# Patient Record
Sex: Female | Born: 1955
Health system: Southern US, Community
[De-identification: ages and names within clinical notes are randomized; demographics above are authoritative.]

## PROBLEM LIST (undated history)

## (undated) DIAGNOSIS — I10 Essential (primary) hypertension: Secondary | ICD-10-CM

## (undated) DIAGNOSIS — J45909 Unspecified asthma, uncomplicated: Secondary | ICD-10-CM

## (undated) HISTORY — PX: NO PAST SURGERIES: SHX2092

## (undated) HISTORY — DX: Essential (primary) hypertension: I10

## (undated) HISTORY — DX: Unspecified asthma, uncomplicated: J45.909

---

## 2003-08-11 ENCOUNTER — Encounter: Payer: Self-pay | Admitting: Family Medicine

## 2003-08-11 LAB — CONVERTED CEMR LAB: Pap Smear: NORMAL

## 2003-08-27 ENCOUNTER — Ambulatory Visit (HOSPITAL_COMMUNITY): Admission: RE | Admit: 2003-08-27 | Discharge: 2003-08-27 | Payer: Self-pay | Admitting: Family Medicine

## 2004-03-20 ENCOUNTER — Emergency Department (HOSPITAL_COMMUNITY): Admission: EM | Admit: 2004-03-20 | Discharge: 2004-03-20 | Payer: Self-pay | Admitting: Emergency Medicine

## 2004-07-13 ENCOUNTER — Ambulatory Visit (HOSPITAL_COMMUNITY): Admission: RE | Admit: 2004-07-13 | Discharge: 2004-07-13 | Payer: Self-pay | Admitting: General Surgery

## 2006-10-23 ENCOUNTER — Ambulatory Visit: Payer: Self-pay | Admitting: Family Medicine

## 2006-10-23 ENCOUNTER — Encounter: Payer: Self-pay | Admitting: Family Medicine

## 2006-10-23 ENCOUNTER — Other Ambulatory Visit: Admission: RE | Admit: 2006-10-23 | Discharge: 2006-10-23 | Payer: Self-pay | Admitting: Family Medicine

## 2006-10-23 LAB — CONVERTED CEMR LAB: Pap Smear: NORMAL

## 2006-10-24 ENCOUNTER — Encounter: Payer: Self-pay | Admitting: Family Medicine

## 2006-10-24 LAB — CONVERTED CEMR LAB
Candida species: NEGATIVE
Chlamydia, DNA Probe: NEGATIVE
GC Probe Amp, Genital: NEGATIVE
Gardnerella vaginalis: POSITIVE — AB
Trichomonal Vaginitis: NEGATIVE

## 2007-01-24 ENCOUNTER — Ambulatory Visit: Payer: Self-pay | Admitting: Family Medicine

## 2007-01-24 LAB — CONVERTED CEMR LAB
BUN: 12 mg/dL (ref 6–23)
Basophils Absolute: 0 10*3/uL (ref 0.0–0.1)
Basophils Relative: 0 % (ref 0–1)
CO2: 24 meq/L (ref 19–32)
Calcium: 8.7 mg/dL (ref 8.4–10.5)
Chloride: 105 meq/L (ref 96–112)
Cholesterol: 145 mg/dL (ref 0–200)
Creatinine, Ser: 0.66 mg/dL (ref 0.40–1.20)
Eosinophils Absolute: 0.3 10*3/uL (ref 0.0–0.7)
Eosinophils Relative: 5 % (ref 0–5)
Glucose, Bld: 72 mg/dL (ref 70–99)
HCT: 37.4 % (ref 36.0–46.0)
HDL: 48 mg/dL (ref >39)
Hemoglobin: 12.3 g/dL (ref 12.0–15.0)
LDL Cholesterol: 86 mg/dL (ref 0–99)
Lymphocytes Relative: 28 % (ref 12–46)
Lymphs Abs: 2 10*3/uL (ref 0.7–3.3)
MCHC: 32.9 g/dL (ref 30.0–36.0)
MCV: 83.3 fL (ref 78.0–100.0)
Monocytes Absolute: 0.4 10*3/uL (ref 0.2–0.7)
Monocytes Relative: 6 % (ref 3–11)
Neutro Abs: 4.5 10*3/uL (ref 1.7–7.7)
Neutrophils Relative %: 61 % (ref 43–77)
Platelets: 389 10*3/uL (ref 150–400)
Potassium: 3.8 meq/L (ref 3.5–5.3)
RBC: 4.49 M/uL (ref 3.87–5.11)
RDW: 14.8 % — ABNORMAL HIGH (ref 11.5–14.0)
Sodium: 139 meq/L (ref 135–145)
TSH: 3.217 microintl units/mL (ref 0.350–5.50)
Total CHOL/HDL Ratio: 3
Triglycerides: 55 mg/dL (ref <150)
VLDL: 11 mg/dL (ref 0–40)
WBC: 7.4 10*3/uL (ref 4.0–10.5)

## 2007-05-07 ENCOUNTER — Encounter: Payer: Self-pay | Admitting: Family Medicine

## 2008-09-16 ENCOUNTER — Ambulatory Visit: Payer: Self-pay | Admitting: Family Medicine

## 2008-09-16 DIAGNOSIS — R5383 Other fatigue: Secondary | ICD-10-CM

## 2008-09-16 DIAGNOSIS — R5381 Other malaise: Secondary | ICD-10-CM | POA: Insufficient documentation

## 2008-09-16 DIAGNOSIS — E663 Overweight: Secondary | ICD-10-CM | POA: Insufficient documentation

## 2008-09-16 DIAGNOSIS — L255 Unspecified contact dermatitis due to plants, except food: Secondary | ICD-10-CM | POA: Insufficient documentation

## 2008-09-17 ENCOUNTER — Ambulatory Visit (HOSPITAL_COMMUNITY): Admission: RE | Admit: 2008-09-17 | Discharge: 2008-09-17 | Payer: Self-pay | Admitting: Family Medicine

## 2008-10-22 ENCOUNTER — Other Ambulatory Visit: Admission: RE | Admit: 2008-10-22 | Discharge: 2008-10-22 | Payer: Self-pay | Admitting: Family Medicine

## 2008-10-22 ENCOUNTER — Encounter: Payer: Self-pay | Admitting: Family Medicine

## 2008-10-22 ENCOUNTER — Ambulatory Visit: Payer: Self-pay | Admitting: Family Medicine

## 2008-10-22 LAB — CONVERTED CEMR LAB: OCCULT 1: NEGATIVE

## 2008-10-27 LAB — CONVERTED CEMR LAB
BUN: 17 mg/dL (ref 6–23)
CO2: 25 meq/L (ref 19–32)
Calcium: 9.2 mg/dL (ref 8.4–10.5)
Chloride: 102 meq/L (ref 96–112)
Cholesterol: 184 mg/dL (ref 0–200)
Creatinine, Ser: 0.69 mg/dL (ref 0.40–1.20)
Glucose, Bld: 68 mg/dL — ABNORMAL LOW (ref 70–99)
HCT: 38.8 % (ref 36.0–46.0)
HDL: 66 mg/dL (ref >39)
Hemoglobin: 12.7 g/dL (ref 12.0–15.0)
LDL Cholesterol: 105 mg/dL — ABNORMAL HIGH (ref 0–99)
MCHC: 32.7 g/dL (ref 30.0–36.0)
MCV: 79.8 fL (ref 78.0–100.0)
Platelets: 463 10*3/uL — ABNORMAL HIGH (ref 150–400)
Potassium: 3.9 meq/L (ref 3.5–5.3)
RBC: 4.86 M/uL (ref 3.87–5.11)
RDW: 14.8 % (ref 11.5–15.5)
Sodium: 141 meq/L (ref 135–145)
TSH: 3.618 microintl units/mL (ref 0.350–4.500)
Total CHOL/HDL Ratio: 2.8
Triglycerides: 63 mg/dL (ref <150)
VLDL: 13 mg/dL (ref 0–40)
WBC: 7 10*3/uL (ref 4.0–10.5)

## 2008-10-31 ENCOUNTER — Encounter: Payer: Self-pay | Admitting: Family Medicine

## 2009-12-01 ENCOUNTER — Ambulatory Visit: Payer: Self-pay | Admitting: Family Medicine

## 2009-12-01 ENCOUNTER — Encounter (INDEPENDENT_AMBULATORY_CARE_PROVIDER_SITE_OTHER): Payer: Self-pay

## 2009-12-01 DIAGNOSIS — I10 Essential (primary) hypertension: Secondary | ICD-10-CM | POA: Insufficient documentation

## 2009-12-01 LAB — CONVERTED CEMR LAB
BUN: 8 mg/dL (ref 6–23)
Basophils Absolute: 0 10*3/uL (ref 0.0–0.1)
Basophils Relative: 0 % (ref 0–1)
CO2: 26 meq/L (ref 19–32)
Calcium: 8.9 mg/dL (ref 8.4–10.5)
Chloride: 101 meq/L (ref 96–112)
Cholesterol: 173 mg/dL (ref 0–200)
Creatinine, Ser: 0.7 mg/dL (ref 0.40–1.20)
Eosinophils Absolute: 0.2 10*3/uL (ref 0.0–0.7)
Eosinophils Relative: 3 % (ref 0–5)
Glucose, Bld: 72 mg/dL (ref 70–99)
HCT: 41.1 % (ref 36.0–46.0)
HDL: 65 mg/dL (ref >39)
Hemoglobin: 12.7 g/dL (ref 12.0–15.0)
LDL Cholesterol: 96 mg/dL (ref 0–99)
Lymphocytes Relative: 29 % (ref 12–46)
Lymphs Abs: 1.6 10*3/uL (ref 0.7–4.0)
MCHC: 30.9 g/dL (ref 30.0–36.0)
MCV: 85.3 fL (ref 78.0–100.0)
Monocytes Absolute: 0.2 10*3/uL (ref 0.1–1.0)
Monocytes Relative: 3 % (ref 3–12)
Neutro Abs: 3.7 10*3/uL (ref 1.7–7.7)
Neutrophils Relative %: 65 % (ref 43–77)
Platelets: 400 10*3/uL (ref 150–400)
Potassium: 4 meq/L (ref 3.5–5.3)
RBC: 4.82 M/uL (ref 3.87–5.11)
RDW: 15.5 % (ref 11.5–15.5)
Sodium: 140 meq/L (ref 135–145)
TSH: 3.946 microintl units/mL (ref 0.350–4.500)
Total CHOL/HDL Ratio: 2.7
Triglycerides: 59 mg/dL (ref <150)
VLDL: 12 mg/dL (ref 0–40)
WBC: 5.6 10*3/uL (ref 4.0–10.5)

## 2010-04-07 ENCOUNTER — Telehealth: Payer: Self-pay | Admitting: Family Medicine

## 2010-04-09 ENCOUNTER — Telehealth: Payer: Self-pay | Admitting: Family Medicine

## 2010-06-13 ENCOUNTER — Encounter: Payer: Self-pay | Admitting: Family Medicine

## 2010-06-14 ENCOUNTER — Encounter: Payer: Self-pay | Admitting: Family Medicine

## 2010-06-22 NOTE — Progress Notes (Signed)
Summary: medicine  Phone Note Call from Patient   Summary of Call: wants something for her sinus and her allergies call in at [facility]  call back at [phone] to let her know Initial call taken by: Lind Guest,  April 07, 2010 9:29 AM  Follow-up for Phone Call        chest congestion, no fever, no  chills or body aches  advised otc decongestant if symptoms worsen or develops fever to call for ov patient agrees Follow-up by: Adella Hare LPN,  April 07, 2010 1:07 PM

## 2010-06-22 NOTE — Assessment & Plan Note (Signed)
Summary: office visit   Vital Signs:  Patient profile:   55 year old female Menstrual status:  irregular Height:      62 inches Weight:      142 pounds BMI:     26.07 O2 Sat:      99 % Pulse rate:   80 / minute Pulse rhythm:   regular Resp:     16 per minute BP sitting:   148 / 100  (left arm) Cuff size:   regular  Vitals Entered By: Everitt Amber LPN (December 01, 2009 10:52 AM)  Nutrition Counseling: Patient's BMI is greater than 25 and therefore counseled on weight management options. CC: has a rash that she gets every year, little dark spots that itch really bad. Wants some meds for her bones to make them stronger   CC:  has a rash that she gets every year and little dark spots that itch really bad. Wants some meds for her bones to make them stronger.  History of Present Illness: Reports  that she has been doing fairly well. Denies recent fever or chills. Denies sinus pressure, nasal congestion , ear pain or sore throat. Denies chest congestion, or cough productive of sputum. Denies chest pain, palpitations, PND, orthopnea or leg swelling.She has not been taking her bP medsas prscribed Denies abdominal pain, nausea, vomitting, diarrhea or constipation. Denies change in bowel movements or bloody stool. Denies dysuria , frequency, incontinence or hesitancy. Denies  joint pain, swelling, or reduced mobility. Denies headaches, vertigo, seizures. Denies depression, anxiety or insomnia. C/o puritic rash with dark spots which tends to appear i the Summer months, she wants cream for this.      Current Medications (verified): 1)  None  Allergies (verified): No Known Drug Allergies  Review of Systems      See HPI General:  Complains of fatigue. Eyes:  Denies blurring and discharge. Derm:  Complains of changes in color of skin and itching. Endo:  Denies excessive hunger, excessive thirst, excessive urination, and heat intolerance. Heme:  Denies abnormal bruising and  bleeding. Allergy:  Denies hives or rash and itching eyes.  Physical Exam  General:  Well-developed,well-nourished,in no acute distress; alert,appropriate and cooperative throughout examination HEENT: No facial asymmetry,  EOMI, No sinus tenderness, TM's Clear, oropharynx  pink and moist.   Chest: Clear to auscultation bilaterally.  CVS: S1, S2, No murmurs, No S3.   Abd: Soft, Nontender.  MS: Adequate ROM spine, hips, shoulders and knees.  Ext: No edema.   CNS: CN 2-12 intact, power tone and sensation normal throughout.   Skin: Intact, nhyperpigmented macular lesions in patches onlimbs Psych: Good eye contact, normal affect.  Memory intact, not anxious or depressed appearing.    Impression & Recommendations:  Problem # 1:  ESSENTIAL HYPERTENSION (ICD-401.9) Assessment Deteriorated  Her updated medication list for this problem includes:    Benazepril Hcl 10 Mg Tabs (Benazepril hcl) .Marland Kitchen... Take 1 tablet by mouth once a day  BP today: 148/100 Prior BP: 140/78 (10/22/2008)  Labs Reviewed: K+: 3.9 (10/22/2008) Creat: : 0.69 (10/22/2008)   Chol: 184 (10/22/2008)   HDL: 66 (10/22/2008)   LDL: 105 (10/22/2008)   TG: 63 (10/22/2008)  Problem # 2:  FATIGUE (ICD-780.79) Assessment: Deteriorated  Orders: T-Basic Metabolic Panel [phone]) T-TSH [phone]) T-CBC w/Diff [phone])  Problem # 3:  CONTACT DERMATITIS&OTHER ECZEMA DUE TO PLANTS (ICD-692.6) Assessment: Deteriorated  The following medications were removed from the medication list:    Betamethasone Valerate 0.1 % Crea (Betamethasone valerate) .Marland KitchenMarland KitchenMarland KitchenMarland Kitchen  Apply to affected area twice daily as needed Her updated medication list for this problem includes:    Betamethasone Dipropionate 0.05 % Crea (Betamethasone dipropionate) .Marland Kitchen... Apply to adffected areas twice daily  for 5 days, then as needed  Complete Medication List: 1)  Betamethasone Dipropionate 0.05 % Crea (Betamethasone dipropionate) .... Apply to adffected areas  twice daily  for 5 days, then as needed 2)  Benazepril Hcl 10 Mg Tabs (Benazepril hcl) .... Take 1 tablet by mouth once a day  Other Orders: T-Lipid Profile ([id number]) Radiology Referral (Radiology)  Patient Instructions: 1)  cPE in 2.5 months. 2)  It is important that you exercise regularly at least 30 minutes 5 times a week. If you develop chest pain, have severe difficulty breathing, or feel very tired , stop exercising immediately and seek medical attention. 3)  You need to lose weight. Consider a lower calorie diet and regular exercise.  4)  Your bp is too high 150/100, i am sending in med at a very low dose 5)  Med is senrt in for your skin. 6)  BMP prior to visit, ICD-9: 7)  Lipid Panel prior to visit, ICD-9:  fasting today 8)  TSH prior to visit, ICD-9: 9)  CBC w/ Diff prior to visit, ICD-9: 10)  Your mamo is to be scheduled Prescriptions: BENAZEPRIL HCL 10 MG TABS (BENAZEPRIL HCL) Take 1 tablet by mouth once a day  #30 x 2   Entered and Authorized by:   Syliva Overman MD   Signed by:   Syliva Overman MD on 12/01/2009   Method used:   Electronically to        [facility] [phone]* (retail)       [address]       [place], [place]  [address]       Ph: [phone] or [phone]       Fax: [phone]   RxID:   [id number] BETAMETHASONE DIPROPIONATE 0.05 % CREA (BETAMETHASONE DIPROPIONATE) apply to adffected areas twice daily  for 5 days, then as needed  #45 gm x 0   Entered and Authorized by:   Syliva Overman MD   Signed by:   Syliva Overman MD on 12/01/2009   Method used:   Electronically to        [facility] [phone]* (retail)       [address]       [place], [place]  [address]       Ph: [phone] or [phone]       Fax: [phone]   RxID:   [id number]

## 2010-06-22 NOTE — Progress Notes (Signed)
Summary: congestion  Phone Note Call from Patient   Summary of Call: [phone] Congestion, she thinks it is her sinus.  she wanted to know if you will call her something in.  Please advise. Initial call taken by: Curtis Sites,  April 09, 2010 8:42 AM  Follow-up for Phone Call        pls advise otc robitussin or mucinex, fluids, and rest, if worse over the weekend, call on monday early to be worked in, if fever or chills etc Follow-up by: Syliva Overman MD,  April 09, 2010 8:45 AM  Additional Follow-up for Phone Call Additional follow up Details #1::        patient aware Additional Follow-up by: Lind Guest,  April 09, 2010 10:46 AM

## 2010-06-22 NOTE — Letter (Signed)
Summary: Work Excuse  [facility]  [address]   [place], [place] [address]   Phone: [phone]  Fax: [phone]    Today's Date: December 01, 2009  Name of Patient: [name] [name]  The above named patient had a medical visit today at: 10:30am.  Please take this into consideration when reviewing the time away from work/school.    Special Instructions:  [*] None  [  ] To be off the remainder of today, returning to the normal work / school schedule tomorrow.  [  ] To be off until the next scheduled appointment on ______________________.  [  ] Other ________________________________________________________________ ________________________________________________________________________   Sincerely yours,   Milus Mallick. Lodema Hong, M.D.

## 2010-07-14 ENCOUNTER — Telehealth: Payer: Self-pay | Admitting: Family Medicine

## 2010-07-20 NOTE — Progress Notes (Signed)
Summary: fragile state  Phone Note Call from Patient   Summary of Call: [name] [name]  her sister in law called and stated that her husband passed away yesterday, which is [name] son and that she is fragile. call back [phone] or cell # [phone] Initial call taken by: Lind Guest,  July 14, 2010 1:39 PM  Follow-up for Phone Call        Do you want to give this patient something for her nerves short term?  Additional Follow-up for Phone Call Additional follow up Details #1::        please send to [facility] patient has called again. did not sleep any last night. Additional Follow-up by: Lind Guest,  July 14, 2010 3:24 PM    Additional Follow-up for Phone Call Additional follow up Details #2::    pls call and advise sorry, she needs to stay with family, I have prescribed xanax pls fax in ,tell her only take as diorected Follow-up by: Syliva Overman MD,  July 14, 2010 4:48 PM  Additional Follow-up for Phone Call Additional follow up Details #3:: Details for Additional Follow-up Action Taken: Patient aware Additional Follow-up by: Everitt Amber LPN,  July 14, 2010 4:53 PM  New/Updated Medications: ALPRAZOLAM 0.25 MG TABS (ALPRAZOLAM) Take 1 tab by mouth at bedtime as needed for anxiety and insomnia Prescriptions: ALPRAZOLAM 0.25 MG TABS (ALPRAZOLAM) Take 1 tab by mouth at bedtime as needed for anxiety and insomnia  #20 x 0   Entered and Authorized by:   Syliva Overman MD   Signed by:   Syliva Overman MD on 07/14/2010   Method used:   Printed then faxed to ...       [address] [phone]* (retail)       [address]       [place], [place]  [address]       Ph: [phone] or [phone]       Fax: [phone]   RxID:   [id number]

## 2010-07-21 ENCOUNTER — Telehealth: Payer: Self-pay | Admitting: Family Medicine

## 2010-07-29 NOTE — Progress Notes (Signed)
Summary: refill  Phone Note Call from Patient   Summary of Call: pt needs a refill on blood pressure pills.  [phone] Initial call taken by: Rudene Anda,  July 21, 2010 1:39 PM  Follow-up for Phone Call        please schedule appt within one month, will send refill for one month only, no additional without ov Follow-up by: Adella Hare LPN,  July 21, 2010 2:10 PM  Additional Follow-up for Phone Call Additional follow up Details #1::        patient has made an appoinment for 4.5.12 earliest one open Additional Follow-up by: Lind Guest,  July 23, 2010 8:10 AM    Prescriptions: BENAZEPRIL HCL 10 MG TABS (BENAZEPRIL HCL) Take 1 tablet by mouth once a day  #30 x 0   Entered by:   Adella Hare LPN   Authorized by:   Syliva Overman MD   Signed by:   Adella Hare LPN on 45/40/9811   Method used:   Electronically to        [facility] [phone]* (retail)       [address]       [place], [place]  [address]       Ph: [phone] or [phone]       Fax: [phone]   RxID:   [id number]

## 2010-08-24 ENCOUNTER — Encounter: Payer: Self-pay | Admitting: Family Medicine

## 2010-08-25 ENCOUNTER — Encounter: Payer: Self-pay | Admitting: Family Medicine

## 2010-08-26 ENCOUNTER — Ambulatory Visit: Payer: Self-pay | Admitting: Family Medicine

## 2010-08-26 ENCOUNTER — Other Ambulatory Visit: Payer: Self-pay

## 2010-08-26 MED ORDER — BENAZEPRIL HCL 10 MG PO TABS: 10.0000 mg | ORAL_TABLET | Freq: Every day | ORAL | Status: DC

## 2010-10-08 NOTE — H&P (Signed)
NAMEMarland Kitchen  [name] [name] NO.:  [id number]   MEDICAL RECORD NO.:  [id number]          PATIENT TYPE:  EMS   LOCATION:  ED                            FACILITY:  [facility]   PHYSICIAN:  Dirk Dress. Katrinka Blazing, M.D.   DATE OF BIRTH:  27-Jan-1956   DATE OF ADMISSION:  03/20/2004  DATE OF DISCHARGE:  10/29/2005LH                                HISTORY & PHYSICAL   [name] [name], a 55 year old female, with a history of a laceration of her  earlobe.  The patient relates it to wearing earrings.  The laceration has  now pulled all the way through her earlobe and she has two dangling masses  which are tender and irritated.  She is unable to wear earrings on this  side.  The patient is scheduled to have the earlobe repaired.   PAST HISTORY:  She is basically healthy.  She has no chronic medical  illness.  She takes no chronic medications.   She has no allergies.   No previous surgery.  She does not drink, smoke or use drugs.   On exam, blood pressure 115/80, pulse 76, respirations 18.  HEENT:  Unremarkable except for healed, tender, and slightly irritated right  earlobe with a V shaped laceration in the inferior aspect of the earlobe.  NECK:  Supple.  CHEST:  Clear.  HEART:  Regular rate and rhythm without murmur, gallops or rub.  ABDOMEN:  Soft, nontender, nondistended.  EXTREMITIES:  No cyanosis, clubbing or edema.  NEUROLOGIC:  No focal motor, sensory or cerebellar deficits.   IMPRESSION:  Chronic laceration right earlobe with recurrent irritation.   PLAN:  Repair of the laceration under local anesthesia.      LCS/MEDQ  D:  07/12/2004  T:  07/13/2004  Job:  [id number]

## 2011-01-26 ENCOUNTER — Telehealth: Payer: Self-pay | Admitting: Family Medicine

## 2011-01-26 NOTE — Telephone Encounter (Signed)
Patient agrees.

## 2011-01-26 NOTE — Telephone Encounter (Signed)
Cough only, advised delsym otc, advised patient if she develops fever or symptoms worsen, call for ov

## 2011-01-28 ENCOUNTER — Encounter: Payer: Self-pay | Admitting: Family Medicine

## 2011-01-28 ENCOUNTER — Ambulatory Visit (INDEPENDENT_AMBULATORY_CARE_PROVIDER_SITE_OTHER): Payer: No Typology Code available for payment source | Admitting: Family Medicine

## 2011-01-28 VITALS — BP 142/80 | HR 78 | Resp 16 | Ht 62.0 in | Wt 148.1 lb

## 2011-01-28 DIAGNOSIS — J40 Bronchitis, not specified as acute or chronic: Secondary | ICD-10-CM

## 2011-01-28 DIAGNOSIS — I10 Essential (primary) hypertension: Secondary | ICD-10-CM

## 2011-01-28 MED ORDER — ALBUTEROL 90 MCG/ACT IN AERS: 2.0000 | INHALATION_SPRAY | RESPIRATORY_TRACT | Status: DC | PRN

## 2011-01-28 MED ORDER — GUAIFENESIN-CODEINE 100-10 MG/5ML PO SYRP: 5.0000 mL | ORAL_SOLUTION | Freq: Two times a day (BID) | ORAL | Status: DC | PRN

## 2011-01-28 MED ORDER — FLUTICASONE PROPIONATE 50 MCG/ACT NA SUSP: 1.0000 | Freq: Two times a day (BID) | NASAL | Status: DC

## 2011-01-28 MED ORDER — AZITHROMYCIN 250 MG PO TABS: ORAL_TABLET | ORAL | Status: AC

## 2011-01-28 NOTE — Assessment & Plan Note (Signed)
Treat for acute bronchitis albuterol inhaler prn, Azithromycin, flonase for nasal congestion

## 2011-01-28 NOTE — Progress Notes (Signed)
  Subjective:    Patient ID: [name] [name], female    DOB: 1956-02-06, 55 y.o.   MRN: [id number]  HPI   Cough congestion x 1 week- feels like her sinus are blocked, cough productive, no fever, +SOB, non smoker, +pleuritic chest pain, +nasal drainage, no sore throat, nausea now resolved. Tried Robitussin DM and OTC sinus pills. She feels that the sinus pills helped some.   Typically once a year has sinus infection.    HTN- stopped taking her medication approx 1 month ago, because she didn't feel like taking her meds   Has a physical scheduled for November       Review of Systems per above      Objective:   Physical Exam  GEN- NAD, alert and oriented x3, pleasant  HEENT- PERRL, EOMI, non injected sclera, pink conjunctiva, MMM, oropharynx clear, TM clear bilat , nares clear Neck- Supple, no thryomegaly Nodes- no cervical nodes CVS- RRR, no murmur RESP-harsh rhonchi bilat, upper airway congestion, scatterdd wheeze, normal WOB, normal oxygen sat EXT- No edema Pulses- Radial, DP- 2+        Assessment & Plan:

## 2011-01-28 NOTE — Assessment & Plan Note (Signed)
Pt to restart meds, will have labs drawn before physical set for November

## 2011-01-28 NOTE — Patient Instructions (Addendum)
Bronchitis Bronchitis is the body's way of reacting to injury and/or infection (inflammation) of the bronchi. Bronchi are the air tubes that extend from the windpipe into the lungs. If the inflammation becomes severe, it may cause shortness of breath.  CAUSES Inflammation may be caused by:  A virus.   Germs (bacteria).   Dust.   Allergens.   Pollutants and many other irritants.  The cells lining the bronchial tree are covered with tiny hairs (cilia). These constantly beat upward, away from the lungs, toward the mouth. This keeps the lungs free of pollutants. When these cells become too irritated and are unable to do their job, mucus begins to develop. This causes the characteristic cough of bronchitis. The cough clears the lungs when the cilia are unable to do their job. Without either of these protective mechanisms, the mucus would settle in the lungs. Then you would develop pneumonia. Smoking is a common cause of bronchitis and can contribute to pneumonia. Stopping this habit is the single most important thing you can do to help yourself. TREATMENT  Your caregiver may prescribe an antibiotic if the cough is caused by bacteria. Also, medicines that open up your airways make it easier to breathe. Your caregiver may also recommend or prescribe an expectorant. It will loosen the mucus to be coughed up. Only take over-the-counter or prescription medicines for pain, discomfort, or fever as directed by your caregiver.   Removing whatever causes the problem (smoking, for example) is critical to preventing the problem from getting worse.   Cough suppressants may be prescribed for relief of cough symptoms.   Inhaled medicines may be prescribed to help with symptoms now and to help prevent problems from returning.   For those with recurrent (chronic) bronchitis, there may be a need for steroid medicines.  SEEK IMMEDIATE MEDICAL CARE IF:  During treatment, you develop more pus-like mucus  (purulent sputum).   You or your child has an oral temperature above 101F not controlled by medicine.   Your baby is older than 3 months with a rectal temperature of 102 F (38.9 C) or higher.   Your baby is 26 months old or younger with a rectal temperature of 100.4 F (38 C) or higher.   You become progressively more ill.   You have increased difficulty breathing, wheezing, or shortness of breath.  It is necessary to seek immediate medical care if you are elderly or sick from any other disease. MAKE SURE YOU:  Understand these instructions.   Will watch your condition.   Will get help right away if you are not doing well or get worse.  Document Released: 05/09/2005 Document Re-Released: 08/03/2009 [employer] Patient Information 2011 [place], [place].   Get your labs done before your physical. If you do not improve come back to be seen.  Take the antibiotics as prescribed, use the albuterol inhaler as needed Restart your blood pressure medication

## 2011-03-10 ENCOUNTER — Encounter: Payer: Self-pay | Admitting: Family Medicine

## 2011-03-15 ENCOUNTER — Ambulatory Visit (INDEPENDENT_AMBULATORY_CARE_PROVIDER_SITE_OTHER): Payer: No Typology Code available for payment source | Admitting: Family Medicine

## 2011-03-15 ENCOUNTER — Other Ambulatory Visit (HOSPITAL_COMMUNITY)
Admission: RE | Admit: 2011-03-15 | Discharge: 2011-03-15 | Disposition: A | Discharge status: Home / Self Care | Payer: No Typology Code available for payment source | Source: Ambulatory Visit | Attending: Family Medicine | Admitting: Family Medicine

## 2011-03-15 ENCOUNTER — Encounter: Payer: Self-pay | Admitting: Family Medicine

## 2011-03-15 VITALS — BP 120/82 | HR 71 | Resp 16 | Ht 62.0 in | Wt 151.1 lb

## 2011-03-15 DIAGNOSIS — J302 Other seasonal allergic rhinitis: Secondary | ICD-10-CM | POA: Insufficient documentation

## 2011-03-15 DIAGNOSIS — J309 Allergic rhinitis, unspecified: Secondary | ICD-10-CM

## 2011-03-15 DIAGNOSIS — I10 Essential (primary) hypertension: Secondary | ICD-10-CM

## 2011-03-15 DIAGNOSIS — Z0000 Encounter for general adult medical examination without abnormal findings: Secondary | ICD-10-CM

## 2011-03-15 DIAGNOSIS — Z1211 Encounter for screening for malignant neoplasm of colon: Secondary | ICD-10-CM

## 2011-03-15 DIAGNOSIS — Z01419 Encounter for gynecological examination (general) (routine) without abnormal findings: Secondary | ICD-10-CM | POA: Insufficient documentation

## 2011-03-15 DIAGNOSIS — Z23 Encounter for immunization: Secondary | ICD-10-CM

## 2011-03-15 LAB — HEMOCCULT GUIAC POC 1CARD (OFFICE): Fecal Occult Blood, POC: NEGATIVE

## 2011-03-15 MED ORDER — LORATADINE 10 MG PO TABS: 10.0000 mg | ORAL_TABLET | Freq: Every day | ORAL | Status: DC

## 2011-03-15 MED ORDER — BENAZEPRIL HCL 10 MG PO TABS: 10.0000 mg | ORAL_TABLET | Freq: Every day | ORAL | Status: DC

## 2011-03-15 NOTE — Patient Instructions (Signed)
F/u in 6 months.  Labs are excellent.  Pls schedule a mammogram.  You are referred for a colonoscopy to screen fopr colon cancer.  It is important that you exercise regularly at least 30 minutes 5 times a week. If you develop chest pain, have severe difficulty breathing, or feel very tired, stop exercising immediately and seek medical attention    Prescription given for allergies.  Antibiotic ointment twice daily to burn

## 2011-03-17 ENCOUNTER — Telehealth: Payer: Self-pay | Admitting: Family Medicine

## 2011-03-17 NOTE — Telephone Encounter (Signed)
The last thing that I see sent in was zpak. Need appt?

## 2011-03-18 ENCOUNTER — Telehealth: Payer: Self-pay | Admitting: Family Medicine

## 2011-03-18 MED ORDER — LORATADINE 10 MG PO TABS: 10.0000 mg | ORAL_TABLET | Freq: Every day | ORAL | Status: DC

## 2011-03-18 NOTE — Telephone Encounter (Signed)
pls verify and document symptoms of infection, if so since she was just here refill z pack x 1 pls

## 2011-03-18 NOTE — Telephone Encounter (Signed)
Just wanted her claritin filled

## 2011-03-18 NOTE — Telephone Encounter (Signed)
Please advise the patient that pap is normal.

## 2011-03-21 NOTE — Telephone Encounter (Signed)
Normal letter sent

## 2011-03-23 ENCOUNTER — Telehealth: Payer: Self-pay

## 2011-03-23 NOTE — Telephone Encounter (Signed)
LMOM to call.

## 2011-03-24 NOTE — Telephone Encounter (Signed)
LMOM to call and mailed letter also.

## 2011-04-04 ENCOUNTER — Encounter: Payer: Self-pay | Admitting: Family Medicine

## 2011-04-05 ENCOUNTER — Encounter: Payer: Self-pay | Admitting: Family Medicine

## 2011-04-15 NOTE — Assessment & Plan Note (Signed)
Controlled, no change in medication  

## 2011-04-15 NOTE — Progress Notes (Signed)
  Subjective:    Patient ID: [name] [name], female    DOB: 1955-09-18, 55 y.o.   MRN: [id number]  HPI The PT is here for annual exam and re-evaluation of chronic medical conditions, medication management and review of any available recent lab and radiology data.  Preventive health is updated, specifically  Cancer screening and Immunization.   Questions or concerns regarding consultations or procedures which the PT has had in the interim are  addressed. The PT denies any adverse reactions to current medications since the last visit.  She lost her husband unexpectedly approx 5 months ago , and is still coping with this as best she can.She neither wants therapy or medication and states she ahs good family support and she is improving    Review of Systems See HPI Denies recent fever or chills. Denies sinus pressure,  ear pain or sore throat. Denies chest congestion, productive cough or wheezing. Denies chest pains, palpitations and leg swelling Denies abdominal pain, nausea, vomiting,diarrhea or constipation.   Denies dysuria, frequency, hesitancy or incontinence. Denies joint pain, swelling and limitation in mobility. Denies headaches, seizures, numbness, or tingling. Denies depression, anxiety or insomnia. Denies skin break down or rash.        Objective:   Physical Exam Pleasant well nourished female, alert and oriented x 3, in no cardio-pulmonary distress. Afebrile. HEENT No facial trauma or asymetry. Sinuses non tender.  EOMI, PERTL, fundoscopic exam is normal, no hemorhage or exudate.  External ears normal, tympanic membranes clear. Oropharynx moist, no exudate, good dentition. Neck: supple, no adenopathy,JVD or thyromegaly.No bruits.  Chest: Clear to ascultation bilaterally.No crackles or wheezes. Non tender to palpation  Breast: No asymetry,no masses. No nipple discharge or inversion. No axillary or supraclavicular adenopathy  Cardiovascular system; Heart sounds  normal,  S1 and  S2 ,no S3.  No murmur, or thrill. Apical beat not displaced Peripheral pulses normal.  Abdomen: Soft, non tender, no organomegaly or masses. No bruits. Bowel sounds normal. No guarding, tenderness or rebound.  Rectal:  No mass. Guaiac negative stool.  GU: External genitalia normal. No lesions. Vaginal canal normal.No discharge. Uterus normal size, no adnexal masses, no cervical motion or adnexal tenderness.  Musculoskeletal exam: Full ROM of spine, hips , shoulders and knees. No deformity ,swelling or crepitus noted. No muscle wasting or atrophy.   Neurologic: Cranial nerves 2 to 12 intact. Power, tone ,sensation and reflexes normal throughout. No disturbance in gait. No tremor.  Skin: Intact, no ulceration, erythema , scaling or rash noted. Pigmentation normal throughout  Psych; Normal mood and affect. Judgement and concentration normal        Assessment & Plan:

## 2011-04-15 NOTE — Assessment & Plan Note (Signed)
Increased symptoms x 1 month , as expected at this time of the year, med prescribed

## 2011-04-25 ENCOUNTER — Emergency Department (HOSPITAL_COMMUNITY)
Admission: EM | Admit: 2011-04-25 | Discharge: 2011-04-25 | Disposition: A | Discharge status: Home / Self Care | Payer: No Typology Code available for payment source | Attending: Emergency Medicine | Admitting: Emergency Medicine

## 2011-04-25 ENCOUNTER — Encounter (HOSPITAL_COMMUNITY): Payer: Self-pay | Admitting: Emergency Medicine

## 2011-04-25 ENCOUNTER — Emergency Department (HOSPITAL_COMMUNITY): Payer: No Typology Code available for payment source

## 2011-04-25 DIAGNOSIS — R110 Nausea: Secondary | ICD-10-CM | POA: Insufficient documentation

## 2011-04-25 DIAGNOSIS — R059 Cough, unspecified: Secondary | ICD-10-CM | POA: Insufficient documentation

## 2011-04-25 DIAGNOSIS — IMO0001 Reserved for inherently not codable concepts without codable children: Secondary | ICD-10-CM | POA: Insufficient documentation

## 2011-04-25 DIAGNOSIS — R5381 Other malaise: Secondary | ICD-10-CM | POA: Insufficient documentation

## 2011-04-25 DIAGNOSIS — I10 Essential (primary) hypertension: Secondary | ICD-10-CM | POA: Insufficient documentation

## 2011-04-25 DIAGNOSIS — R05 Cough: Secondary | ICD-10-CM | POA: Insufficient documentation

## 2011-04-25 DIAGNOSIS — R000 Tachycardia, unspecified: Secondary | ICD-10-CM | POA: Insufficient documentation

## 2011-04-25 DIAGNOSIS — J069 Acute upper respiratory infection, unspecified: Secondary | ICD-10-CM

## 2011-04-25 LAB — URINALYSIS, ROUTINE W REFLEX MICROSCOPIC
Glucose, UA: NEGATIVE mg/dL
Leukocytes, UA: NEGATIVE
Nitrite: NEGATIVE
pH: 6 (ref 5.0–8.0)

## 2011-04-25 LAB — BASIC METABOLIC PANEL
CO2: 29 mEq/L (ref 19–32)
Chloride: 100 mEq/L (ref 96–112)
GFR calc Af Amer: >90 mL/min (ref >90)
Potassium: 3.4 mEq/L — ABNORMAL LOW (ref 3.5–5.1)
Sodium: 138 mEq/L (ref 135–145)

## 2011-04-25 LAB — CBC
HCT: 38.5 % (ref 36.0–46.0)
Platelets: 409 10*3/uL — ABNORMAL HIGH (ref 150–400)
RDW: 14.3 % (ref 11.5–15.5)
WBC: 14.6 10*3/uL — ABNORMAL HIGH (ref 4.0–10.5)

## 2011-04-25 LAB — DIFFERENTIAL
Basophils Absolute: 0 10*3/uL (ref 0.0–0.1)
Lymphocytes Relative: 3 % — ABNORMAL LOW (ref 12–46)
Neutro Abs: 13.3 10*3/uL — ABNORMAL HIGH (ref 1.7–7.7)
Neutrophils Relative %: 91 % — ABNORMAL HIGH (ref 43–77)

## 2011-04-25 MED ORDER — IBUPROFEN 800 MG PO TABS
800.0000 mg | ORAL_TABLET | Freq: Once | ORAL | Status: AC
  Administered 2011-04-25: 800 mg via ORAL
  Filled 2011-04-25: qty 1

## 2011-04-25 MED ORDER — SODIUM CHLORIDE 0.9 % IV BOLUS (SEPSIS)
1000.0000 mL | Freq: Once | INTRAVENOUS | Status: AC
  Administered 2011-04-25: 1000 mL via INTRAVENOUS

## 2011-04-25 MED ORDER — AZITHROMYCIN 250 MG PO TABS: ORAL_TABLET | ORAL | Status: DC

## 2011-04-25 MED ORDER — ONDANSETRON HCL 4 MG/2ML IJ SOLN
4.0000 mg | Freq: Once | INTRAMUSCULAR | Status: AC
  Administered 2011-04-25: 4 mg via INTRAVENOUS
  Filled 2011-04-25: qty 2

## 2011-04-25 MED ORDER — AZITHROMYCIN 250 MG PO TABS
500.0000 mg | ORAL_TABLET | Freq: Once | ORAL | Status: AC
  Administered 2011-04-25: 500 mg via ORAL
  Filled 2011-04-25: qty 2

## 2011-04-25 MED ORDER — MORPHINE SULFATE 4 MG/ML IJ SOLN
2.0000 mg | Freq: Once | INTRAMUSCULAR | Status: AC
  Administered 2011-04-25: 2 mg via INTRAVENOUS

## 2011-04-25 MED ORDER — MORPHINE SULFATE 4 MG/ML IJ SOLN
INTRAMUSCULAR | Status: AC
  Filled 2011-04-25: qty 1

## 2011-04-25 NOTE — ED Notes (Addendum)
Pt reports having cough x 3 days with green phlegm. Pt states she took some cough medication last night that made her sick. Pt states she had n/v/d last night. Pt denies any fevers. Pt complains of abdominal pain 5/10. Pt alert at this time and appears lethargic.

## 2011-04-25 NOTE — ED Notes (Signed)
Pt denies any pain or nausea at this time.

## 2011-04-25 NOTE — ED Provider Notes (Signed)
History    This chart was scribed for [employer]. Colon Branch, MD, MD by Smitty Pluck. The patient was seen in room APA09 and the patient's care was started at 9:38AM.   CSN: [id number] Arrival date & time: 04/25/2011  8:56 AM   First MD Initiated Contact with Patient 04/25/11 0913      Chief Complaint  Patient presents with  . Ingestion    (Consider location/radiation/quality/duration/timing/severity/associated sxs/prior treatment) The history is provided by the patient.   [name] [name] is a 55 y.o. female who presents to the Emergency Department complaining of feeling sick with overall body ache and lethargy after taking cough medication 3 days ago for productive cough with green mucus and she has accompanying fever of 103 for 3 days. Pt mentions feeling nauseated and vomiting after coughing. She denies swelling in extremities. PCP is Dr. Lodema Hong.  Past Medical History  Diagnosis Date  . Hypertension     History reviewed. No pertinent past surgical history.  Family History  Problem Relation Age of Onset  . Arthritis Mother     History  Substance Use Topics  . Smoking status: Never Smoker   . Smokeless tobacco: Not on file  . Alcohol Use: No    OB History    Grav Para Term Preterm Abortions TAB SAB Ect Mult Living                  Review of Systems  All other systems reviewed and are negative.   10 Systems reviewed and are negative for acute change except as noted in the HPI.  Allergies  Review of patient's allergies indicates no known allergies.  Home Medications   Current Outpatient Rx  Name Route Sig Dispense Refill  . BENAZEPRIL HCL 10 MG PO TABS Oral Take 1 tablet (10 mg total) by mouth daily. 30 tablet 3  . CALCIUM CARBONATE-VITAMIN D 500-200 MG-UNIT PO TABS Oral Take 1 tablet by mouth daily.      . IBUPROFEN 200 MG PO TABS Oral Take 400 mg by mouth every 6 (six) hours as needed. For pain     . LORATADINE 10 MG PO TABS Oral Take 1 tablet (10 mg total) by  mouth daily. 30 tablet 5    BP 127/67  Pulse 110  Temp(Src) 99.5 F (37.5 C) (Oral)  Resp 16  Ht 5\' 3"  (1.6 m)  Wt 149 lb (67.586 kg)  BMI 26.39 kg/m2  SpO2 100%  Physical Exam  Nursing note and vitals reviewed. Constitutional: She is oriented to person, place, and time. She appears well-developed and well-nourished. No distress.  HENT:  Head: Normocephalic and atraumatic.  Left Ear: External ear normal.  Nose: Nose normal.  Eyes: Conjunctivae and EOM are normal. Pupils are equal, round, and reactive to light.  Neck: Normal range of motion. Neck supple. No tracheal deviation present. No thyromegaly present.  Cardiovascular: Regular rhythm and normal heart sounds.  Exam reveals no friction rub.   No murmur heard.      Tachycardic  Pulmonary/Chest: Effort normal and breath sounds normal. No respiratory distress. She has no wheezes. She has no rales.  Abdominal: Soft. Bowel sounds are normal. She exhibits no distension. There is no tenderness.  Neurological: She is alert and oriented to person, place, and time.  Skin: Skin is warm and dry.       Hot -fever  Psychiatric: She has a normal mood and affect. Her behavior is normal.    ED Course  Procedures (including  critical care time)  DIAGNOSTIC STUDIES: Oxygen Saturation is 100% on room air, normal by my interpretation.   Results for orders placed during the hospital encounter of 04/25/11  CBC      Component Value Range   WBC 14.6 (*) 4.0 - 10.5 (K/uL)   RBC 4.71  3.87 - 5.11 (MIL/uL)   Hemoglobin 12.6  12.0 - 15.0 (g/dL)   HCT 13.2  44.0 - 10.2 (%)   MCV 81.7  78.0 - 100.0 (fL)   MCH 26.8  26.0 - 34.0 (pg)   MCHC 32.7  30.0 - 36.0 (g/dL)   RDW 72.5  36.6 - 44.0 (%)   Platelets 409 (*) 150 - 400 (K/uL)  DIFFERENTIAL      Component Value Range   Neutrophils Relative 91 (*) 43 - 77 (%)   Neutro Abs 13.3 (*) 1.7 - 7.7 (K/uL)   Lymphocytes Relative 3 (*) 12 - 46 (%)   Lymphs Abs 0.5 (*) 0.7 - 4.0 (K/uL)   Monocytes  Relative 5  3 - 12 (%)   Monocytes Absolute 0.8  0.1 - 1.0 (K/uL)   Eosinophils Relative 0  0 - 5 (%)   Eosinophils Absolute 0.0  0.0 - 0.7 (K/uL)   Basophils Relative 0  0 - 1 (%)   Basophils Absolute 0.0  0.0 - 0.1 (K/uL)  BASIC METABOLIC PANEL      Component Value Range   Sodium 138  135 - 145 (mEq/L)   Potassium 3.4 (*) 3.5 - 5.1 (mEq/L)   Chloride 100  96 - 112 (mEq/L)   CO2 29  19 - 32 (mEq/L)   Glucose, Bld 116 (*) 70 - 99 (mg/dL)   BUN 11  6 - 23 (mg/dL)   Creatinine, Ser 3.47  0.50 - 1.10 (mg/dL)   Calcium 9.4  8.4 - 42.5 (mg/dL)   GFR calc non Af Amer >90  >90 (mL/min)   GFR calc Af Amer >90  >90 (mL/min)  URINALYSIS, ROUTINE W REFLEX MICROSCOPIC      Component Value Range   Color, Urine YELLOW  YELLOW    APPearance CLEAR  CLEAR    Specific Gravity, Urine 1.020  1.005 - 1.030    pH 6.0  5.0 - 8.0    Glucose, UA NEGATIVE  NEGATIVE (mg/dL)   Hgb urine dipstick NEGATIVE  NEGATIVE    Bilirubin Urine NEGATIVE  NEGATIVE    Ketones, ur NEGATIVE  NEGATIVE (mg/dL)   Protein, ur NEGATIVE  NEGATIVE (mg/dL)   Urobilinogen, UA 1.0  0.0 - 1.0 (mg/dL)   Nitrite NEGATIVE  NEGATIVE    Leukocytes, UA NEGATIVE  NEGATIVE    COORDINATION OF CARE:   Dg Chest Port 1 View  04/25/2011  *RADIOLOGY REPORT*  Clinical Data: Fever.  Chest pain and shortness of breath.  PORTABLE CHEST - 1 VIEW  Comparison: 03/20/2004  Findings: Midline trachea.  Normal heart size and mediastinal contours. No pleural effusion or pneumothorax.  Diffuse peribronchial thickening.  Somewhat more confluent opacities at the lung bases are felt to represent interstitial thickening and mild volume loss.  IMPRESSION:  1. No acute cardiopulmonary disease. 2. Peribronchial thickening which may relate to chronic bronchitis or smoking. 3.  Mild volume loss at the lung bases.  If high clinical concern of acute infection, PA and lateral films should be considered to exclude less likely lower lobe pneumonia.  Original Report  Authenticated By: Consuello Bossier, M.D.   2:05PM Recheck: Pt states she is feeling better and  pain is alleviated somewhat. Dr discusses possible diagnosis, lab results and medicine treatment course (abx). Pt is ready for discharge.      MDM  Patient with cough, nausea, fever,myalgias. Received IVF, analgesic, antipyretics. Took PO fluids. Labs unremarkable. Xray with suggestion of early LLL.Initiated treatment with antibiotics.Pt feels improved after observation and/or treatment in ED.Pt stable in ED with no significant deterioration in condition.The patient appears reasonably screened and/or stabilized for discharge and I doubt any other medical condition or other [facility] requiring further screening, evaluation, or treatment in the ED at this time prior to discharge.  I personally performed the services described in this documentation, which was scribed in my presence. The recorded information has been reviewed and considered.  MDM Reviewed: nursing note and vitals Interpretation: labs and x-ray          Nicoletta Dress. Colon Branch, MD 04/26/11 [phone]

## 2011-04-25 NOTE — ED Notes (Signed)
Pt c/o cold sx x 3 days. Pt states she took 2 5ml doses of "presciption cough medication" at 0000 and 0200 today. Pt then c/o n/v/d at 0500 this am Pt lethargic/pale. Pt temp 103.0.

## 2011-04-25 NOTE — ED Notes (Signed)
Pt tolerating fluids without difficulty. No c/o nausea.

## 2011-04-27 ENCOUNTER — Other Ambulatory Visit: Payer: Self-pay | Admitting: Family Medicine

## 2011-04-27 ENCOUNTER — Telehealth: Payer: Self-pay | Admitting: Family Medicine

## 2011-04-27 MED ORDER — PROMETHAZINE-DM 6.25-15 MG/5ML PO SYRP: ORAL_SOLUTION | ORAL | Status: DC

## 2011-04-27 NOTE — Telephone Encounter (Signed)
Med sent and left message for patient  

## 2011-04-27 NOTE — Telephone Encounter (Signed)
Med entered historicalyl pls send in after you spk with her

## 2011-04-27 NOTE — Telephone Encounter (Signed)
Off the phone about this

## 2011-04-28 ENCOUNTER — Telehealth: Payer: Self-pay | Admitting: Family Medicine

## 2011-04-28 ENCOUNTER — Other Ambulatory Visit: Payer: Self-pay | Admitting: Family Medicine

## 2011-04-28 DIAGNOSIS — J40 Bronchitis, not specified as acute or chronic: Secondary | ICD-10-CM

## 2011-04-28 MED ORDER — CHLORPHENIRAMINE-HYDROCODONE 8-10 MG/5ML PO LQCR: 5.0000 mL | Freq: Two times a day (BID) | ORAL | Status: DC | PRN

## 2011-04-28 NOTE — Telephone Encounter (Signed)
Spoke with pt and she states that her pharmacy does not have the Promethazine-DM in stock and that they do not know when the shipment will arrive.  She is asking for an alternative.  She also said that she was prescribed an inhaler in the past and that she did not get it because of the cost.  She is also asking for an alternative for this.

## 2011-04-28 NOTE — Telephone Encounter (Signed)
tussionex has been prescribed, the albuterol comes as a generic equivalent she needs to ask her opharmacy for the lowest availble cost of this med, pls resend the proventil , print and write dispense lowest available cost pls

## 2011-04-29 MED ORDER — ALBUTEROL 90 MCG/ACT IN AERS: 2.0000 | INHALATION_SPRAY | RESPIRATORY_TRACT | Status: DC | PRN

## 2011-04-29 NOTE — Telephone Encounter (Signed)
Spoke with pt and informed that medicine had been sent in and that she could call the pharmacy to find out the cost of the inhaler.

## 2011-09-14 ENCOUNTER — Ambulatory Visit: Payer: No Typology Code available for payment source | Admitting: Family Medicine

## 2011-09-22 ENCOUNTER — Ambulatory Visit: Payer: No Typology Code available for payment source | Admitting: Family Medicine

## 2011-09-28 ENCOUNTER — Encounter: Payer: Self-pay | Admitting: Family Medicine

## 2011-09-28 ENCOUNTER — Ambulatory Visit (INDEPENDENT_AMBULATORY_CARE_PROVIDER_SITE_OTHER): Payer: No Typology Code available for payment source | Admitting: Family Medicine

## 2011-09-28 VITALS — BP 120/80 | HR 80 | Resp 16 | Ht 62.0 in | Wt 150.8 lb

## 2011-09-28 DIAGNOSIS — I10 Essential (primary) hypertension: Secondary | ICD-10-CM

## 2011-09-28 DIAGNOSIS — R5381 Other malaise: Secondary | ICD-10-CM

## 2011-09-28 DIAGNOSIS — L819 Disorder of pigmentation, unspecified: Secondary | ICD-10-CM | POA: Insufficient documentation

## 2011-09-28 DIAGNOSIS — E663 Overweight: Secondary | ICD-10-CM

## 2011-09-28 DIAGNOSIS — B369 Superficial mycosis, unspecified: Secondary | ICD-10-CM

## 2011-09-28 DIAGNOSIS — R7301 Impaired fasting glucose: Secondary | ICD-10-CM

## 2011-09-28 DIAGNOSIS — J309 Allergic rhinitis, unspecified: Secondary | ICD-10-CM

## 2011-09-28 MED ORDER — NYSTATIN 100000 UNIT/GM EX POWD: CUTANEOUS | Status: DC

## 2011-09-28 MED ORDER — LORATADINE 10 MG PO TABS: 10.0000 mg | ORAL_TABLET | Freq: Every day | ORAL | Status: DC

## 2011-09-28 MED ORDER — CLOTRIMAZOLE-BETAMETHASONE 1-0.05 % EX CREA: TOPICAL_CREAM | Freq: Two times a day (BID) | CUTANEOUS | Status: DC

## 2011-09-28 MED ORDER — BENAZEPRIL HCL 10 MG PO TABS: 10.0000 mg | ORAL_TABLET | Freq: Every day | ORAL | Status: DC

## 2011-09-28 NOTE — Patient Instructions (Signed)
CPE first week in November.  Lipid, hBa1C, chem 7 today.  Medication is sent in for fungal and yeast infection, keep area under breasts dry, and do not use under wire bras.  Yoou need a mammogram, this is very important.  It is important that you exercise regularly at least 30 minutes 5 times a week. If you develop chest pain, have severe difficulty breathing, or feel very tired, stop exercising immediately and seek medical attention  A healthy diet is rich in fruit, vegetables and whole grains. Poultry fish, nuts and beans are a healthy choice for protein rather then red meat. A low sodium diet and drinking 64 ounces of water daily is generally recommended. Oils and sweet should be limited. Carbohydrates especially for those who are diabetic or overweight, should be limited to 30-45 gram per meal. It is important to eat on a regular schedule, at least 3 times daily. Snacks should be primarily fruits, vegetables or nuts.  Weight loss goal of 10 pounds

## 2011-09-28 NOTE — Progress Notes (Signed)
  Subjective:    Patient ID: [name] [name], female    DOB: 06-13-1955, 56 y.o.   MRN: [id number]  HPI Pruritic rash under breasts, darkening of skin on nasal bridge.Requests medication for both, otherwise no new concerns.  Plans to be mre consistent with lifestyle change to improve health and facilitate weight loss. The PT is here for follow up and re-evaluation of chronic medical conditions, medication management and review of any available recent lab and radiology data.  Preventive health is updated, specifically  Cancer screening and Immunization. Mammogram overdue   The PT denies any adverse reactions to current medications since the last visit.  There are no new concerns.       Review of Systems See HPI Denies recent fever or chills. Denies sinus pressure, nasal congestion, ear pain or sore throat. Denies chest congestion, productive cough or wheezing. Denies chest pains, palpitations and leg swelling Denies abdominal pain, nausea, vomiting,diarrhea or constipation.   Denies dysuria, frequency, hesitancy or incontinence. Denies joint pain, swelling and limitation in mobility. Denies headaches, seizures, numbness, or tingling. Denies depression, anxiety or insomnia.       Objective:   Physical Exam Patient alert and oriented and in no cardiopulmonary distress.  HEENT: No facial asymmetry, EOMI, no sinus tenderness,  oropharynx pink and moist.  Neck supple no adenopathy.  Chest: Clear to auscultation bilaterally.  CVS: S1, S2 no murmurs, no S3.  ABD: Soft non tender. Bowel sounds normal.  Ext: No edema  MS: Adequate ROM spine, shoulders, hips and knees.  Skin: Intact, fungal and yeast infection under breast and darkening of skin on nasal bridge  Psych: Good eye contact, normal affect. Memory intact not anxious or depressed appearing.  CNS: CN 2-12 intact, power, tone and sensation normal throughout.        Assessment & Plan:

## 2011-09-29 LAB — LIPID PANEL
Cholesterol: 151 mg/dL (ref 0–200)
HDL: 62 mg/dL (ref >39)
Total CHOL/HDL Ratio: 2.4 Ratio

## 2011-09-29 LAB — HEMOGLOBIN A1C: Mean Plasma Glucose: 123 mg/dL — ABNORMAL HIGH (ref <117)

## 2011-09-29 LAB — BASIC METABOLIC PANEL
CO2: 30 mEq/L (ref 19–32)
Chloride: 102 mEq/L (ref 96–112)
Creat: 0.7 mg/dL (ref 0.50–1.10)
Potassium: 3.8 mEq/L (ref 3.5–5.3)

## 2011-10-02 NOTE — Assessment & Plan Note (Signed)
unchanged Patient re-educated about  the importance of commitment to a  minimum of 150 minutes of exercise per week. The importance of healthy food choices with portion control discussed. Encouraged to start a food diary, count calories and to consider  joining a support group. Sample diet sheets offered. Goals set by the patient for the next several months.    

## 2011-10-02 NOTE — Assessment & Plan Note (Signed)
Controlled, no change in medication  

## 2011-10-02 NOTE — Assessment & Plan Note (Signed)
Currently has fungal and yeast infection under breasts which are large, re educated to keep skin dry,underwire bras not the best option, and med prescribed

## 2011-10-03 ENCOUNTER — Other Ambulatory Visit: Payer: Self-pay | Admitting: Family Medicine

## 2011-10-03 MED ORDER — HYDROQUINONE 2 % EX CREA: TOPICAL_CREAM | Freq: Two times a day (BID) | CUTANEOUS | Status: DC

## 2011-11-18 ENCOUNTER — Telehealth: Payer: Self-pay | Admitting: Family Medicine

## 2011-11-18 MED ORDER — BENAZEPRIL HCL 10 MG PO TABS: 10.0000 mg | ORAL_TABLET | Freq: Every day | ORAL | Status: DC

## 2011-11-18 NOTE — Telephone Encounter (Signed)
Called patient to come in for BP evaluation - says she's still at work - can't come in.  Advised direction from Dr. Lodema Hong was to come in since BP too high.  Patient going back to Employee Health Nurse to be rechecked and will call back and advise.  JSH

## 2011-11-18 NOTE — Telephone Encounter (Signed)
Patient called back - Employee Nurse checked BP again - down to 182/99.  Patient advised she cannot leave work to come in for evaluation.  Advised she is surrounded by doctors/nurses on her job and she will be fine.  Advised patient that since she is unable to come into office, go directly to ED should she have any problems/concerns later today.  JSH

## 2011-11-18 NOTE — Telephone Encounter (Signed)
BP was 187/112. Spoke with patient and she had just taken her scheduled BP med and was gonna get the nurse to check it again shortly. They agreed to let her stay if she took it easy. Does she need to increase her BP med? She states she has been under a lot of stress with her father being very ill

## 2011-11-18 NOTE — Telephone Encounter (Signed)
BP was elevated and work was going to send home. Has not taken her morning BP med yet. Called pt and left message NOT to double up on her BP med. To just take the one prescribed and to call back with any further questions

## 2011-11-18 NOTE — Telephone Encounter (Signed)
ideaaly this pt needs ov this pnm with Mdif any openings available for Bp eval and management, if numbers are that high should be out work, plssched an appt asap for BP eval, today if possible

## 2011-11-21 ENCOUNTER — Ambulatory Visit (INDEPENDENT_AMBULATORY_CARE_PROVIDER_SITE_OTHER): Payer: No Typology Code available for payment source | Admitting: Family Medicine

## 2011-11-21 ENCOUNTER — Encounter: Payer: Self-pay | Admitting: Family Medicine

## 2011-11-21 VITALS — BP 150/100 | HR 79 | Resp 16 | Ht 62.0 in | Wt 147.8 lb

## 2011-11-21 DIAGNOSIS — R7303 Prediabetes: Secondary | ICD-10-CM

## 2011-11-21 DIAGNOSIS — I10 Essential (primary) hypertension: Secondary | ICD-10-CM

## 2011-11-21 DIAGNOSIS — R5383 Other fatigue: Secondary | ICD-10-CM

## 2011-11-21 DIAGNOSIS — R7309 Other abnormal glucose: Secondary | ICD-10-CM

## 2011-11-21 DIAGNOSIS — R7301 Impaired fasting glucose: Secondary | ICD-10-CM

## 2011-11-21 DIAGNOSIS — R5381 Other malaise: Secondary | ICD-10-CM

## 2011-11-21 DIAGNOSIS — E663 Overweight: Secondary | ICD-10-CM

## 2011-11-21 MED ORDER — BENAZEPRIL-HYDROCHLOROTHIAZIDE 10-12.5 MG PO TABS: 1.0000 | ORAL_TABLET | Freq: Every day | ORAL | Status: DC

## 2011-11-21 NOTE — Patient Instructions (Addendum)
F/u in middle to end August.  EKG today hBA1C, TSH , and chem 7 non fasting in August before visit.  Dose increase on blood pressure medication, stop benazepril 10mg  in the morning, new tablet prescribed.(benazepril/hctz Take an extra half benazepril 10mg  tablet today please

## 2011-11-21 NOTE — Progress Notes (Signed)
  Subjective:    Patient ID: [name] [name], female    DOB: Feb 22, 1956, 56 y.o.   MRN: [id number]  HPI The PT is here for follow up and re-evaluation of chronic medical conditions, medication management and review of any available recent lab and radiology data.  Preventive health is updated, specifically  Cancer screening and Immunization.   Pt had called in from her job 3 days ago stating bP was 180/120, she put off coming in till today. States she is stressed since her step father is in hospice in [place] Has had intermittent headaches, states her sl;eep is good, denies chest pain[palpitations, PND or orthopnea      Review of Systems See HPI Denies recent fever or chills. Denies sinus pressure, nasal congestion, ear pain or sore throat. Denies chest congestion, productive cough or wheezing. Denies chest pains, palpitations and leg swelling Denies abdominal pain, nausea, vomiting,diarrhea or constipation.   Denies dysuria, frequency, hesitancy or incontinence. Denies joint pain, swelling and limitation in mobility. Denies headaches, seizures, numbness, or tingling. denies skin break down or rash.        Objective:   Physical Exam  Patient alert and oriented and in no cardiopulmonary distress.  HEENT: No facial asymmetry, EOMI, no sinus tenderness,  oropharynx pink and moist.  Neck supple no adenopathy.  Chest: Clear to auscultation bilaterally.  CVS: S1, S2 no murmurs, no S3. EKG:LVH, no ischemia, NSR  ABD: Soft non tender. Bowel sounds normal.  Ext: No edema  MS: Adequate ROM spine, shoulders, hips and knees.  Skin: Intact, no ulcerations or rash noted.  Psych: Good eye contact, normal affect. Memory intact not anxious or depressed appearing.  CNS: CN 2-12 intact, power, tone and sensation normal throughout.       Assessment & Plan:

## 2011-11-22 DIAGNOSIS — R7303 Prediabetes: Secondary | ICD-10-CM | POA: Insufficient documentation

## 2011-11-22 LAB — BASIC METABOLIC PANEL
CO2: 29 mEq/L (ref 19–32)
Chloride: 102 mEq/L (ref 96–112)
Creat: 0.69 mg/dL (ref 0.50–1.10)
Potassium: 4 mEq/L (ref 3.5–5.3)

## 2011-11-22 LAB — TSH: TSH: 3.804 u[IU]/mL (ref 0.350–4.500)

## 2011-11-22 NOTE — Assessment & Plan Note (Signed)
The importance of dietary change to low carb diet, exercise and weight loss stressed

## 2011-11-22 NOTE — Assessment & Plan Note (Signed)
unchanged Patient re-educated about  the importance of commitment to a  minimum of 150 minutes of exercise per week. The importance of healthy food choices with portion control discussed. Encouraged to start a food diary, count calories and to consider  joining a support group. Sample diet sheets offered. Goals set by the patient for the next several months.    

## 2011-11-22 NOTE — Assessment & Plan Note (Addendum)
Uncontrolled with LVH, change in BP medication, also dietary advice to reduce sodium, and increase fresh fruit and veg discussed

## 2011-12-01 ENCOUNTER — Encounter: Payer: Self-pay | Admitting: Family Medicine

## 2012-01-19 ENCOUNTER — Ambulatory Visit: Payer: No Typology Code available for payment source | Admitting: Family Medicine

## 2012-04-03 ENCOUNTER — Encounter: Payer: Self-pay | Admitting: Family Medicine

## 2012-04-03 ENCOUNTER — Ambulatory Visit (INDEPENDENT_AMBULATORY_CARE_PROVIDER_SITE_OTHER): Payer: No Typology Code available for payment source | Admitting: Family Medicine

## 2012-04-03 VITALS — BP 144/84 | HR 83 | Resp 18 | Ht 62.0 in | Wt 147.0 lb

## 2012-04-03 DIAGNOSIS — Z0000 Encounter for general adult medical examination without abnormal findings: Secondary | ICD-10-CM

## 2012-04-03 DIAGNOSIS — R7309 Other abnormal glucose: Secondary | ICD-10-CM

## 2012-04-03 DIAGNOSIS — R7303 Prediabetes: Secondary | ICD-10-CM

## 2012-04-03 DIAGNOSIS — Z1211 Encounter for screening for malignant neoplasm of colon: Secondary | ICD-10-CM

## 2012-04-03 DIAGNOSIS — E663 Overweight: Secondary | ICD-10-CM

## 2012-04-03 NOTE — Progress Notes (Signed)
  Subjective:    Patient ID: [name] [name], female    DOB: [birth year], 56 y.o.   MRN: [id number]  HPI The PT is here forannual exam and re-evaluation of chronic medical conditions, medication management and review of any available recent lab and radiology data.  Preventive health is updated, specifically  Cancer screening and Immunization.   Questions or concerns regarding consultations or procedures which the PT has had in the interim are  addressed. The PT denies any adverse reactions to current medications since the last visit.  There are no new concerns.  There are no specific complaints       Review of Systems See HPI Denies recent fever or chills. Denies sinus pressure, nasal congestion, ear pain or sore throat. Denies chest congestion, productive cough or wheezing. Denies chest pains, palpitations and leg swelling Denies abdominal pain, nausea, vomiting,diarrhea or constipation.   Denies dysuria, frequency, hesitancy or incontinence. Denies joint pain, swelling and limitation in mobility. Denies headaches, seizures, numbness, or tingling. Denies depression, anxiety or insomnia. Denies skin break down or rash.        Objective:   Physical Exam  Pleasant well nourished female, alert and oriented x 3, in no cardio-pulmonary distress. Afebrile. HEENT No facial trauma or asymetry. Sinuses non tender.  EOMI, PERTL, fundoscopic exam  no hemorhage or exudate.  External ears normal, tympanic membranes clear. Oropharynx moist, no exudate, good dentition. Neck: supple, no adenopathy,JVD or thyromegaly.No bruits.  Chest: Clear to ascultation bilaterally.No crackles or wheezes. Non tender to palpation  Breast: No asymetry,no masses. No nipple discharge or inversion. No axillary or supraclavicular adenopathy  Cardiovascular system; Heart sounds normal,  S1 and  S2 ,no S3.  No murmur, or thrill. Apical beat not displaced Peripheral pulses normal.  Abdomen: Soft, non  tender, no organomegaly or masses. No bruits. Bowel sounds normal. No guarding, tenderness or rebound.  Rectal:  No mass. Guaiac negative stool.  GU: External genitalia normal. No lesions. Vaginal canal normal.No discharge. Uterus normal size, no adnexal masses, no cervical motion or adnexal tenderness.  Musculoskeletal exam: Full ROM of spine, hips , shoulders and knees. No deformity ,swelling or crepitus noted. No muscle wasting or atrophy.   Neurologic: Cranial nerves 2 to 12 intact. Power, tone ,sensation and reflexes normal throughout. No disturbance in gait. No tremor.  Skin: Intact, no ulceration, erythema , scaling or rash noted. Pigmentation normal throughout  Psych; Normal mood and affect. Judgement and concentration normal       Assessment & Plan:

## 2012-04-03 NOTE — Patient Instructions (Addendum)
F/u in 4 month, please call if you need mebefore  You have not lost any weight  Please start exercise every day for 30 minutes minimum, eat mainly vegetable and fruit, drink only water , and cut down salt intake. Blood pressure is higher than it should be, and you may become diabetic unless you lose weight and change lifestyle.  Colonoscopy referral to dr Karilyn Cota  Fasting chem 7 , hBa1C in 4 month

## 2012-04-04 ENCOUNTER — Encounter (INDEPENDENT_AMBULATORY_CARE_PROVIDER_SITE_OTHER): Payer: Self-pay | Admitting: *Deleted

## 2012-04-08 DIAGNOSIS — Z0000 Encounter for general adult medical examination without abnormal findings: Secondary | ICD-10-CM | POA: Insufficient documentation

## 2012-04-08 NOTE — Assessment & Plan Note (Signed)
Annual general exam, including pelvic, rectal and breast are done and documented. Re education and encouragement re the need for change to a healthier lifestyle also reviewed  Referral for colonoscopy which is past due entered , pt states she will comply

## 2012-04-09 ENCOUNTER — Other Ambulatory Visit: Payer: Self-pay

## 2012-04-09 DIAGNOSIS — I10 Essential (primary) hypertension: Secondary | ICD-10-CM

## 2012-04-09 MED ORDER — BENAZEPRIL-HYDROCHLOROTHIAZIDE 10-12.5 MG PO TABS: 1.0000 | ORAL_TABLET | Freq: Every day | ORAL | Status: DC

## 2012-06-05 ENCOUNTER — Telehealth: Payer: Self-pay | Admitting: Family Medicine

## 2012-06-05 NOTE — Telephone Encounter (Signed)
Pt states she is much better but still having a little wheezing and a little cough. No production. Advised robitussin and she wants to know if you recommend  anything else

## 2012-06-05 NOTE — Telephone Encounter (Signed)
No other suggestion, good advice

## 2012-06-05 NOTE — Telephone Encounter (Signed)
Called and left message for her to call back and provide more info on when it started, fever? And if the mucus is productive or has any color

## 2012-06-05 NOTE — Telephone Encounter (Signed)
Noted  

## 2012-07-31 ENCOUNTER — Ambulatory Visit: Payer: No Typology Code available for payment source | Admitting: Family Medicine

## 2012-09-17 ENCOUNTER — Telehealth: Payer: Self-pay | Admitting: Family Medicine

## 2012-09-17 DIAGNOSIS — I10 Essential (primary) hypertension: Secondary | ICD-10-CM

## 2012-09-18 MED ORDER — BENAZEPRIL-HYDROCHLOROTHIAZIDE 10-12.5 MG PO TABS: 1.0000 | ORAL_TABLET | Freq: Every day | ORAL | Status: DC

## 2012-09-18 NOTE — Telephone Encounter (Signed)
Changed the pharmacy in the computer to [facility] based on message and sent in the med requested

## 2012-09-26 ENCOUNTER — Encounter: Payer: Self-pay | Admitting: Family Medicine

## 2012-09-26 ENCOUNTER — Ambulatory Visit (INDEPENDENT_AMBULATORY_CARE_PROVIDER_SITE_OTHER): Payer: 59 | Admitting: Family Medicine

## 2012-09-26 VITALS — BP 130/82 | HR 70 | Resp 16 | Ht 62.0 in | Wt 145.1 lb

## 2012-09-26 DIAGNOSIS — J302 Other seasonal allergic rhinitis: Secondary | ICD-10-CM

## 2012-09-26 DIAGNOSIS — L819 Disorder of pigmentation, unspecified: Secondary | ICD-10-CM

## 2012-09-26 DIAGNOSIS — E663 Overweight: Secondary | ICD-10-CM

## 2012-09-26 DIAGNOSIS — R7309 Other abnormal glucose: Secondary | ICD-10-CM

## 2012-09-26 DIAGNOSIS — R5381 Other malaise: Secondary | ICD-10-CM

## 2012-09-26 DIAGNOSIS — I10 Essential (primary) hypertension: Secondary | ICD-10-CM

## 2012-09-26 DIAGNOSIS — R7303 Prediabetes: Secondary | ICD-10-CM

## 2012-09-26 DIAGNOSIS — J309 Allergic rhinitis, unspecified: Secondary | ICD-10-CM

## 2012-09-26 DIAGNOSIS — Z79899 Other long term (current) drug therapy: Secondary | ICD-10-CM

## 2012-09-26 MED ORDER — HYDROQUINONE 2 % EX CREA: TOPICAL_CREAM | Freq: Two times a day (BID) | CUTANEOUS | Status: DC

## 2012-09-26 MED ORDER — BENAZEPRIL-HYDROCHLOROTHIAZIDE 10-12.5 MG PO TABS: 1.0000 | ORAL_TABLET | Freq: Every day | ORAL | Status: DC

## 2012-09-26 MED ORDER — LORATADINE 10 MG PO TABS: 10.0000 mg | ORAL_TABLET | Freq: Every day | ORAL | Status: DC

## 2012-09-26 NOTE — Patient Instructions (Addendum)
cPE in 6 month  You need a mammogram this is past due , please schedule.  You need a colonoscopy, please check with your ins, let me know where you want it and if you have a particular Doc you want  Fasting CBC, lipid, cmp, hBA1C, TSH and Vit D  It is important that you exercise regularly at least 30 minutes 5 times a week. If you develop chest pain, have severe difficulty breathing, or feel very tired, stop exercising immediately and seek medical attention    A healthy diet is rich in fruit, vegetables and whole grains. Poultry fish, nuts and beans are a healthy choice for protein rather then red meat. A low sodium diet and drinking 64 ounces of water daily is generally recommended. Oils and sweet should be limited. Carbohydrates especially for those who are diabetic or overweight, should be limited to 30-45 gram per meal. It is important to eat on a regular schedule, at least 3 times daily. Snacks should be primarily fruits, vegetables or nuts.  No change in BP meds

## 2012-09-26 NOTE — Progress Notes (Signed)
  Subjective:    Patient ID: [name] [name], female    DOB: Mar 18, 1956, 57 y.o.   MRN: [id number]  HPI The PT is here for follow up and re-evaluation of chronic medical conditions, medication management and review of any available recent lab and radiology data.  Preventive health is updated, specifically  Cancer screening and Immunization. Checking cost of colonoscopy still  Questions or concerns regarding consultations or procedures which the PT has had in the interim are  addressed. The PT denies any adverse reactions to current medications since the last visit.  C/o uncontrolled allergy symptoms, with increased nasal congestion and sneezing i the past month, no fever or chills C/o hyperpigmented areas on skin, no itch, requests topical med to address this. Working n healthy lifestyles to promote weight loss and improved health with modest success      Review of Systems See HPI Denies recent fever or chills. Denies ear pain or sore throat. Denies chest congestion, productive cough or wheezing. Denies chest pains, palpitations and leg swelling Denies abdominal pain, nausea, vomiting,diarrhea or constipation.   Denies dysuria, frequency, hesitancy or incontinence. Denies joint pain, swelling and limitation in mobility. Denies headaches, seizures, numbness, or tingling. Denies depression, anxiety or insomnia.       Objective:   Physical Exam Patient alert and oriented and in no cardiopulmonary distress.  HEENT: No facial asymmetry, EOMI, no sinus tenderness,  oropharynx pink and moist.  Neck supple no adenopathy.nasal mucosa erythematous and edematous  Chest: Clear to auscultation bilaterally.  CVS: S1, S2 no murmurs, no S3.  ABD: Soft non tender. Bowel sounds normal.  Ext: No edema  MS: Adequate ROM spine, shoulders, hips and knees.  Skin: Intact, no ulcerations or rash noted.  Psych: Good eye contact, normal affect. Memory intact not anxious or depressed  appearing.  CNS: CN 2-12 intact, power, tone and sensation normal throughout.        Assessment & Plan:

## 2012-09-30 NOTE — Assessment & Plan Note (Signed)
Improved. Pt applauded on succesful weight loss through lifestyle change, and encouraged to continue same. Weight loss goal set for the next several months.  

## 2012-09-30 NOTE — Assessment & Plan Note (Signed)
Uncontrolled, medication sent for daily use

## 2012-09-30 NOTE — Assessment & Plan Note (Signed)
Ongoing issue, pt requests med for same, cream prescribed

## 2012-09-30 NOTE — Assessment & Plan Note (Signed)
Controlled, no change in medication DASH diet and commitment to daily physical activity for a minimum of 30 minutes discussed and encouraged, as a part of hypertension management. The importance of attaining a healthy weight is also discussed.  

## 2012-09-30 NOTE — Assessment & Plan Note (Signed)
Patient educated about the importance of limiting  Carbohydrate intake , the need to commit to daily physical activity for a minimum of 30 minutes , and to commit weight loss. The fact that changes in all these areas will reduce or eliminate all together the development of diabetes is stressed.   Updated lab needed 

## 2012-12-11 ENCOUNTER — Ambulatory Visit (INDEPENDENT_AMBULATORY_CARE_PROVIDER_SITE_OTHER): Payer: 59 | Admitting: Family Medicine

## 2012-12-11 ENCOUNTER — Encounter: Payer: Self-pay | Admitting: Family Medicine

## 2012-12-11 VITALS — BP 136/90 | HR 87 | Resp 16 | Ht 62.0 in | Wt 149.8 lb

## 2012-12-11 DIAGNOSIS — J302 Other seasonal allergic rhinitis: Secondary | ICD-10-CM

## 2012-12-11 DIAGNOSIS — I10 Essential (primary) hypertension: Secondary | ICD-10-CM

## 2012-12-11 DIAGNOSIS — R7303 Prediabetes: Secondary | ICD-10-CM

## 2012-12-11 DIAGNOSIS — L819 Disorder of pigmentation, unspecified: Secondary | ICD-10-CM

## 2012-12-11 DIAGNOSIS — R7309 Other abnormal glucose: Secondary | ICD-10-CM

## 2012-12-11 DIAGNOSIS — E663 Overweight: Secondary | ICD-10-CM

## 2012-12-11 DIAGNOSIS — B369 Superficial mycosis, unspecified: Secondary | ICD-10-CM | POA: Insufficient documentation

## 2012-12-11 DIAGNOSIS — G4700 Insomnia, unspecified: Secondary | ICD-10-CM | POA: Insufficient documentation

## 2012-12-11 DIAGNOSIS — J309 Allergic rhinitis, unspecified: Secondary | ICD-10-CM

## 2012-12-11 DIAGNOSIS — Z1211 Encounter for screening for malignant neoplasm of colon: Secondary | ICD-10-CM

## 2012-12-11 MED ORDER — CLOTRIMAZOLE-BETAMETHASONE 1-0.05 % EX CREA: TOPICAL_CREAM | Freq: Two times a day (BID) | CUTANEOUS | Status: DC

## 2012-12-11 MED ORDER — HYDROXYZINE HCL 10 MG PO TABS: ORAL_TABLET | ORAL | Status: DC

## 2012-12-11 MED ORDER — CLOTRIMAZOLE-BETAMETHASONE 1-0.05 % EX CREA: TOPICAL_CREAM | Freq: Two times a day (BID) | CUTANEOUS | Status: AC

## 2012-12-11 NOTE — Patient Instructions (Addendum)
CPE early December, call if you need me before  Commit to regular daily sleep of at least 6 hours, and take medication to help with this until you get into the routine, hydroxyzine is prescribed  You will get info on sleep hygiene  Use cream prescribed twice daily for 1 week to rash on left buttock this will take care of the itchin that area  It is important that you exercise regularly at least 30 minutes 5 times a week. If you develop chest pain, have severe difficulty breathing, or feel very tired, stop exercising immediately and seek medical attention   Labs are excellent, blood sugar now normal  You are referred for screening colonoscopy, Dr Patty Sermons office will contact you  Cream for bleaching dark spots will be sent in later, if you over use you will get light spots, so BE CAREFUL!

## 2012-12-12 ENCOUNTER — Other Ambulatory Visit: Payer: Self-pay

## 2012-12-12 DIAGNOSIS — G4700 Insomnia, unspecified: Secondary | ICD-10-CM

## 2012-12-12 MED ORDER — HYDROXYZINE HCL 10 MG PO TABS: ORAL_TABLET | ORAL | Status: AC

## 2012-12-14 ENCOUNTER — Encounter (INDEPENDENT_AMBULATORY_CARE_PROVIDER_SITE_OTHER): Payer: Self-pay | Admitting: *Deleted

## 2012-12-16 MED ORDER — HYDROQUINONE 2 % EX CREA: TOPICAL_CREAM | CUTANEOUS | Status: AC

## 2012-12-16 NOTE — Assessment & Plan Note (Signed)
Deteriorated. Patient re-educated about  the importance of commitment to a  minimum of 150 minutes of exercise per week. The importance of healthy food choices with portion control discussed. Encouraged to start a food diary, count calories and to consider  joining a support group. Sample diet sheets offered. Goals set by the patient for the next several months.    

## 2012-12-16 NOTE — Assessment & Plan Note (Signed)
Current labs show correction, pt applauded on this

## 2012-12-16 NOTE — Assessment & Plan Note (Signed)
Controlled, no change in medication  

## 2012-12-16 NOTE — Assessment & Plan Note (Signed)
Sleep hygiene reviewed, hydroxyzine prescribed as pt has c/o "itch"

## 2012-12-16 NOTE — Progress Notes (Signed)
  Subjective:    Patient ID: [name] [name], female    DOB: [birth year], 57 y.o.   MRN: [id number]  HPI Pt in with major c/o uncontrolled itch in buttock area and rash, specifically denies vaginal d/c an is not currently sexually active.Also still c/o hyperpigmented ski lesions did not fill cram previously prescribed , needs to C/o anxiety and poor sleep, young widow, now hoping for a new partner, does state she keeps too busy and gets insufficient sleep wants help Still has not done colonoscopy states now ready Recent lab data brought in by pt and is excellent   Review of Systems See HPI Denies recent fever or chills. Denies sinus pressure, nasal congestion, ear pain or sore throat. Denies chest congestion, productive cough or wheezing. Denies chest pains, palpitations and leg swelling Denies abdominal pain, nausea, vomiting,diarrhea or constipation.   Denies dysuria, frequency, hesitancy or incontinence. Denies joint pain, swelling and limitation in mobility. Denies headaches, seizures, numbness, or tingling.         Objective:   Physical Exam  Patient alert and oriented and in no cardiopulmonary distress.slightly anxious  HEENT: No facial asymmetry, EOMI, no sinus tenderness,  oropharynx pink and moist.  Neck supple no adenopathy.  Chest: Clear to auscultation bilaterally.  CVS: S1, S2 no murmurs, no S3.  ABD: Soft non tender. Bowel sounds normal.  Ext: No edema  MS: Adequate ROM spine, shoulders, hips and knees.  Skin: fungal infection on left buttock area in area of concern Psych: Good eye contact, normal affect. Memory intactt anxious not  depressed appearing.  CNS: CN 2-12 intact, power, tone and sensation normal throughout.       Assessment & Plan:

## 2012-12-16 NOTE — Assessment & Plan Note (Signed)
Fungal rash in perineum, topical antifungal prescribed

## 2012-12-16 NOTE — Assessment & Plan Note (Signed)
Controlled, no change in medication DASH diet and commitment to daily physical activity for a minimum of 30 minutes discussed and encouraged, as a part of hypertension management. The importance of attaining a healthy weight is also discussed.  

## 2012-12-16 NOTE — Assessment & Plan Note (Signed)
Still c/o skin pigmentation, did not fill last script apparently this is re sent

## 2013-03-15 ENCOUNTER — Ambulatory Visit: Payer: Self-pay | Admitting: Family Medicine

## 2013-03-27 ENCOUNTER — Encounter: Payer: 59 | Admitting: Family Medicine

## 2013-04-04 ENCOUNTER — Encounter: Payer: Self-pay | Admitting: Family Medicine

## 2013-04-24 ENCOUNTER — Ambulatory Visit (INDEPENDENT_AMBULATORY_CARE_PROVIDER_SITE_OTHER): Payer: 59 | Admitting: Family Medicine

## 2013-04-24 ENCOUNTER — Encounter: Payer: Self-pay | Admitting: Family Medicine

## 2013-04-24 ENCOUNTER — Other Ambulatory Visit (HOSPITAL_COMMUNITY)
Admission: RE | Admit: 2013-04-24 | Discharge: 2013-04-24 | Disposition: A | Discharge status: Home / Self Care | Payer: 59 | Source: Ambulatory Visit | Attending: Family Medicine | Admitting: Family Medicine

## 2013-04-24 ENCOUNTER — Encounter (INDEPENDENT_AMBULATORY_CARE_PROVIDER_SITE_OTHER): Payer: Self-pay

## 2013-04-24 VITALS — BP 138/86 | HR 74 | Resp 16 | Ht 62.0 in | Wt 147.8 lb

## 2013-04-24 DIAGNOSIS — Z1211 Encounter for screening for malignant neoplasm of colon: Secondary | ICD-10-CM

## 2013-04-24 DIAGNOSIS — J309 Allergic rhinitis, unspecified: Secondary | ICD-10-CM

## 2013-04-24 DIAGNOSIS — Z01419 Encounter for gynecological examination (general) (routine) without abnormal findings: Secondary | ICD-10-CM | POA: Insufficient documentation

## 2013-04-24 DIAGNOSIS — J302 Other seasonal allergic rhinitis: Secondary | ICD-10-CM

## 2013-04-24 DIAGNOSIS — Z0000 Encounter for general adult medical examination without abnormal findings: Secondary | ICD-10-CM

## 2013-04-24 LAB — POC HEMOCCULT BLD/STL (OFFICE/1-CARD/DIAGNOSTIC): Fecal Occult Blood, POC: NEGATIVE

## 2013-04-24 MED ORDER — FLUTICASONE PROPIONATE 50 MCG/ACT NA SUSP: 1.0000 | Freq: Every day | NASAL | Status: DC

## 2013-04-24 NOTE — Patient Instructions (Addendum)
F/U in 6 month, call if you need me before  Use cornstarch to groin to keep this dry, and antifungal cream for 2 to 3 days if you start to itch  Use benadryl one daily for allergies and to help with sleep  Flonase sent in for allergies  It is important that you exercise regularly at least 30 minutes 5 times a week. If you develop chest pain, have severe difficulty breathing, or feel very tired, stop exercising immediately and seek medical attention    Congrats on excellent labs, blood pressure and weight loss  You will get the letter with the number to call to schedule and get your colonoscopy done

## 2013-04-24 NOTE — Progress Notes (Signed)
   Subjective:    Patient ID: [name] [name], female    DOB: [birth year], 57 y.o.   MRN: [id number]  HPI Pt in for annual exam.  C/o recurrent pruritic rash in groin ,and increased moisture, requests help for this. Needs to schedule colonoscopy  still has not followed through on this but states that she intends to C/o increased allergy symptoms for which she will be treated , excessive watery nose and sneezing with change in season, denies fever or chills    Review of Systems See HPI Denies recent fever or chills. Denies sinus pressure,  ear pain or sore throat. Denies chest congestion, productive cough or wheezing. Denies chest pains, palpitations and leg swelling Denies abdominal pain, nausea, vomiting,diarrhea or constipation.   Denies dysuria, frequency, hesitancy or incontinence. Denies joint pain, swelling and limitation in mobility. Denies headaches, seizures, numbness, or tingling. Denies depression, anxiety or insomnia. Denies skin break down or rash.        Objective:   Physical Exam Pleasant well nourished female, alert and oriented x 3, in no cardio-pulmonary distress. Afebrile. HEENT No facial trauma or asymetry. Sinuses non tender.  EOMI, PERTL, fundoscopic exam is normal, no hemorhage or exudate.  External ears normal, tympanic membranes clear. Oropharynx moist, no exudate, good dentition. Neck: supple, no adenopathy,JVD or thyromegaly.No bruits.  Chest: Clear to ascultation bilaterally.No crackles or wheezes. Non tender to palpation  Breast: No asymetry,no masses. No nipple discharge or inversion. No axillary or supraclavicular adenopathy  Cardiovascular system; Heart sounds normal,  S1 and  S2 ,no S3.  No murmur, or thrill. Apical beat not displaced Peripheral pulses normal.  Abdomen: Soft, non tender, no organomegaly or masses. No bruits. Bowel sounds normal. No guarding, tenderness or rebound.  Rectal:  No mass. Guaiac negative  stool.  GU: External genitalia normal. No lesions. Vaginal canal normal.No discharge. Uterus normal size, no adnexal masses, no cervical motion or adnexal tenderness.  Musculoskeletal exam: Full ROM of spine, hips , shoulders and knees. No deformity ,swelling or crepitus noted. No muscle wasting or atrophy.   Neurologic: Cranial nerves 2 to 12 intact. Power, tone ,sensation and reflexes normal throughout. No disturbance in gait. No tremor.  Skin: Intact, mild fungal rash in groin bilterally, no skin breakdown or drainage noted Pigmentation normal throughout  Psych; Normal mood and affect. Judgement and concentration normal        Assessment & Plan:

## 2013-06-11 ENCOUNTER — Other Ambulatory Visit: Payer: Self-pay

## 2013-06-11 ENCOUNTER — Telehealth: Payer: Self-pay | Admitting: *Deleted

## 2013-06-11 MED ORDER — FLUCONAZOLE 150 MG PO TABS: 150.0000 mg | ORAL_TABLET | Freq: Every day | ORAL | Status: DC

## 2013-06-11 NOTE — Telephone Encounter (Signed)
pls see msg response on Sarajane MarekJanice Sommers for you to send in med and let her know

## 2013-06-11 NOTE — Telephone Encounter (Signed)
Pt states she was given an antifungal cream for a rash on her private area and she has used it for 2 weeks and the area is still itching with small amount of redness. Wants a pill called in for fungus to her pharmacy.

## 2013-06-11 NOTE — Telephone Encounter (Signed)
Pt called stating Dr. Lodema HongSimpson gave her some cream and pt states it is not helping that much and would like to get a antibiotic instead. Please advise [phone]

## 2013-06-11 NOTE — Telephone Encounter (Signed)
Send in flucaonazole 150mg  #2 and let her know please

## 2013-06-12 NOTE — Telephone Encounter (Signed)
Med sent and patient aware 

## 2013-06-30 ENCOUNTER — Encounter (HOSPITAL_COMMUNITY): Payer: Self-pay | Admitting: Emergency Medicine

## 2013-06-30 ENCOUNTER — Emergency Department (HOSPITAL_COMMUNITY)
Admission: EM | Admit: 2013-06-30 | Discharge: 2013-06-30 | Disposition: A | Discharge status: Home / Self Care | Payer: 59 | Attending: Emergency Medicine | Admitting: Emergency Medicine

## 2013-06-30 DIAGNOSIS — R059 Cough, unspecified: Secondary | ICD-10-CM

## 2013-06-30 DIAGNOSIS — R05 Cough: Secondary | ICD-10-CM | POA: Insufficient documentation

## 2013-06-30 DIAGNOSIS — R062 Wheezing: Secondary | ICD-10-CM | POA: Insufficient documentation

## 2013-06-30 DIAGNOSIS — I10 Essential (primary) hypertension: Secondary | ICD-10-CM | POA: Insufficient documentation

## 2013-06-30 DIAGNOSIS — J45909 Unspecified asthma, uncomplicated: Secondary | ICD-10-CM | POA: Insufficient documentation

## 2013-06-30 DIAGNOSIS — Z79899 Other long term (current) drug therapy: Secondary | ICD-10-CM | POA: Insufficient documentation

## 2013-06-30 DIAGNOSIS — J3489 Other specified disorders of nose and nasal sinuses: Secondary | ICD-10-CM | POA: Insufficient documentation

## 2013-06-30 DIAGNOSIS — IMO0002 Reserved for concepts with insufficient information to code with codable children: Secondary | ICD-10-CM | POA: Insufficient documentation

## 2013-06-30 MED ORDER — ALBUTEROL SULFATE HFA 108 (90 BASE) MCG/ACT IN AERS
2.0000 | INHALATION_SPRAY | Freq: Once | RESPIRATORY_TRACT | Status: AC
  Administered 2013-06-30: 2 via RESPIRATORY_TRACT
  Filled 2013-06-30: qty 6.7

## 2013-06-30 MED ORDER — GUAIFENESIN-CODEINE 100-10 MG/5ML PO SYRP: 10.0000 mL | ORAL_SOLUTION | Freq: Three times a day (TID) | ORAL | Status: DC | PRN

## 2013-06-30 NOTE — Discharge Instructions (Signed)
Cough, Adult  A cough is a reflex. It helps you clear your throat and airways. A cough can help heal your body. A cough can last 2 or 3 weeks (acute) or may last more than 8 weeks (chronic). Some common causes of a cough can include an infection, allergy, or a cold. HOME CARE  Only take medicine as told by your doctor.  If given, take your medicines (antibiotics) as told. Finish them even if you start to feel better.  Use a cold steam vaporizer or humidier in your home. This can help loosen thick spit (secretions).  Sleep so you are almost sitting up (semi-upright). Use pillows to do this. This helps reduce coughing.  Rest as needed.  Stop smoking if you smoke. GET HELP RIGHT AWAY IF:  You have yellowish-white fluid (pus) in your thick spit.  Your cough gets worse.  Your medicine does not reduce coughing, and you are losing sleep.  You cough up blood.  You have trouble breathing.  Your pain gets worse and medicine does not help.  You have a fever. MAKE SURE YOU:   Understand these instructions.  Will watch your condition.  Will get help right away if you are not doing well or get worse. Document Released: 01/20/2011 Document Revised: 08/01/2011 Document Reviewed: 01/20/2011 [employer] Patient Information 2014 [employer].  

## 2013-06-30 NOTE — ED Provider Notes (Signed)
CSN: [id number]     Arrival date & time 06/30/13  1122 History  This chart was scribed for non-physician practitioner, Pauline Aus, PA-C,working with Flint Melter, MD, by Karle Plumber, ED Scribe.  This patient was seen in room APFT23/APFT23 and the patient's care was started at 11:50 AM.  Chief Complaint  Patient presents with  . Cough   The history is provided by the patient. No language interpreter was used.   HPI Comments:  [name] [name] is a 58 y.o. female with h/o HTN, who presents to the Emergency Department complaining of productive cough of clear phlegm and congestion that started earlier today. She reports associated wheezing at night when she lays down to go to sleep. Pt states once she starts coughing she cannot stop. She denies post-tussive emesis or taking any medication for her symptoms today. She reports diarrhea and vomiting that started five days ago, but has now resolved. Tolerates food and fluids without difficulty now.  She denies fever or chest tightness. She reports h/o childhood asthma.    Past Medical History  Diagnosis Date  . Hypertension    No past surgical history on file. Family History  Problem Relation Age of Onset  . Arthritis Mother    History  Substance Use Topics  . Smoking status: Never Smoker   . Smokeless tobacco: Not on file  . Alcohol Use: No   OB History   Grav Para Term Preterm Abortions TAB SAB Ect Mult Living                 Review of Systems  Constitutional: Negative for fever, chills and fatigue.  HENT: Positive for congestion. Negative for sore throat and trouble swallowing.   Respiratory: Positive for cough and wheezing (night time). Negative for chest tightness and shortness of breath.   Cardiovascular: Negative for chest pain and palpitations.  Gastrointestinal: Negative for nausea, vomiting, abdominal pain and blood in stool.  Genitourinary: Negative for dysuria, hematuria and flank pain.  Musculoskeletal: Negative  for arthralgias, back pain, myalgias, neck pain and neck stiffness.  Skin: Negative for rash.  Neurological: Negative for dizziness, weakness and numbness.  Hematological: Does not bruise/bleed easily.    Allergies  Review of patient's allergies indicates no known allergies.  Home Medications   Current Outpatient Rx  Name  Route  Sig  Dispense  Refill  . benazepril-hydrochlorthiazide (LOTENSIN HCT) 10-12.5 MG per tablet   Oral   Take 1 tablet by mouth daily.   90 tablet   3   . calcium-vitamin D (OSCAL WITH D) 500-200 MG-UNIT per tablet   Oral   Take 1 tablet by mouth daily.           . fluconazole (DIFLUCAN) 150 MG tablet   Oral   Take 1 tablet (150 mg total) by mouth daily.   2 tablet   0   . fluticasone (FLONASE) 50 MCG/ACT nasal spray   Each Nare   Place 1 spray into both nostrils daily.   15 g   2    Triage Vitals: BP 110/73  Pulse 90  Temp(Src) 98.2 F (36.8 C) (Oral)  Resp 20  SpO2 100% Physical Exam  Nursing note and vitals reviewed. Constitutional: She is oriented to person, place, and time. She appears well-developed and well-nourished. No distress.  HENT:  Head: Normocephalic and atraumatic.  Right Ear: Tympanic membrane, external ear and ear canal normal.  Left Ear: Tympanic membrane, external ear and ear canal normal.  Nose:  Mucosal edema and rhinorrhea present.  Mouth/Throat: Uvula is midline, oropharynx is clear and moist and mucous membranes are normal. No trismus in the jaw. No uvula swelling. No oropharyngeal exudate, posterior oropharyngeal edema, posterior oropharyngeal erythema or tonsillar abscesses.  Eyes: Conjunctivae are normal.  Neck: Normal range of motion and phonation normal. Neck supple. No Brudzinski's sign and no Kernig's sign noted.  Cardiovascular: Normal rate, regular rhythm, normal heart sounds and intact distal pulses.   No murmur heard. Pulmonary/Chest: Effort normal. No respiratory distress. She has wheezes (couse lung  sound bilaterally with scattered inspiratory and expiratory wheezes.). She has no rales. She exhibits no tenderness.  Abdominal: Soft.  Musculoskeletal: She exhibits no edema.  Lymphadenopathy:    She has no cervical adenopathy.  Neurological: She is alert and oriented to person, place, and time. She exhibits normal muscle tone. Coordination normal.  Skin: Skin is warm and dry.    ED Course  Procedures (including critical care time) DIAGNOSTIC STUDIES: Oxygen Saturation is 100% on RA, normal by my interpretation.   COORDINATION OF CARE: 11:56 AM- Will prescribe albuterol MDI and cough suppressant. Pt verbalizes understanding and agrees to plan.  Medications - No data to display  Labs Review Labs Reviewed - No data to display Imaging Review No results found.  EKG Interpretation   None       MDM  Vitals stable. PERC negative, no indication for imaging at this time.  Pt agrees to symptomatic treatment and close f/u with her PMD.  She appears stable for d/c.    I personally performed the services described in this documentation, which was scribed in my presence. The recorded information has been reviewed and is accurate.    Shonia Skilling L. Khris Jansson, PA-C 07/02/13 1313

## 2013-06-30 NOTE — ED Notes (Signed)
Pt reports productive cough and congestion since Wednesday. Pt denies fever.

## 2013-07-01 ENCOUNTER — Ambulatory Visit (INDEPENDENT_AMBULATORY_CARE_PROVIDER_SITE_OTHER): Payer: 59 | Admitting: Family Medicine

## 2013-07-01 ENCOUNTER — Telehealth: Payer: Self-pay | Admitting: Family Medicine

## 2013-07-01 VITALS — BP 102/74 | HR 68 | Resp 18 | Wt 136.0 lb

## 2013-07-01 DIAGNOSIS — F3289 Other specified depressive episodes: Secondary | ICD-10-CM

## 2013-07-01 DIAGNOSIS — J309 Allergic rhinitis, unspecified: Secondary | ICD-10-CM

## 2013-07-01 DIAGNOSIS — Z13220 Encounter for screening for lipoid disorders: Secondary | ICD-10-CM

## 2013-07-01 DIAGNOSIS — J302 Other seasonal allergic rhinitis: Secondary | ICD-10-CM

## 2013-07-01 DIAGNOSIS — F32A Depression, unspecified: Secondary | ICD-10-CM

## 2013-07-01 DIAGNOSIS — J209 Acute bronchitis, unspecified: Secondary | ICD-10-CM

## 2013-07-01 DIAGNOSIS — Z130 Encounter for screening for diseases of the blood and blood-forming organs and certain disorders involving the immune mechanism: Secondary | ICD-10-CM

## 2013-07-01 DIAGNOSIS — J4541 Moderate persistent asthma with (acute) exacerbation: Secondary | ICD-10-CM | POA: Insufficient documentation

## 2013-07-01 DIAGNOSIS — Z1321 Encounter for screening for nutritional disorder: Secondary | ICD-10-CM

## 2013-07-01 DIAGNOSIS — E663 Overweight: Secondary | ICD-10-CM

## 2013-07-01 DIAGNOSIS — F329 Major depressive disorder, single episode, unspecified: Secondary | ICD-10-CM

## 2013-07-01 DIAGNOSIS — Z13228 Encounter for screening for other metabolic disorders: Secondary | ICD-10-CM

## 2013-07-01 DIAGNOSIS — I10 Essential (primary) hypertension: Secondary | ICD-10-CM

## 2013-07-01 DIAGNOSIS — J45901 Unspecified asthma with (acute) exacerbation: Secondary | ICD-10-CM

## 2013-07-01 DIAGNOSIS — R7309 Other abnormal glucose: Secondary | ICD-10-CM

## 2013-07-01 DIAGNOSIS — Z1329 Encounter for screening for other suspected endocrine disorder: Secondary | ICD-10-CM

## 2013-07-01 DIAGNOSIS — R7303 Prediabetes: Secondary | ICD-10-CM

## 2013-07-01 MED ORDER — ALBUTEROL SULFATE HFA 108 (90 BASE) MCG/ACT IN AERS: 2.0000 | INHALATION_SPRAY | Freq: Four times a day (QID) | RESPIRATORY_TRACT | Status: DC | PRN

## 2013-07-01 MED ORDER — AZITHROMYCIN 250 MG PO TABS: ORAL_TABLET | ORAL | Status: DC

## 2013-07-01 MED ORDER — FLUCONAZOLE 150 MG PO TABS: ORAL_TABLET | ORAL | Status: DC

## 2013-07-01 MED ORDER — PREDNISONE (PAK) 5 MG PO TABS: 5.0000 mg | ORAL_TABLET | ORAL | Status: DC

## 2013-07-01 MED ORDER — METHYLPREDNISOLONE ACETATE 80 MG/ML IJ SUSP
80.0000 mg | Freq: Once | INTRAMUSCULAR | Status: AC
  Administered 2013-07-01: 80 mg via INTRAMUSCULAR

## 2013-07-01 MED ORDER — PREDNISONE (PAK) 5 MG PO TABS
5.0000 mg | ORAL_TABLET | ORAL | Status: DC
Start: 2013-07-01 — End: 2013-07-01

## 2013-07-01 NOTE — Progress Notes (Signed)
   Subjective:    Patient ID: [name] [name], female    DOB: [birth year], 58 y.o.   MRN: [id number]  HPI Felt ill 1 week ago started coughing , mainly white sputum , no fever or chill, then she felt as if she was developing asthma attack, states she wads diagnosed with and treated for asthma as a child. Cough has worsened. She has developed intermittent chills and low grade fever. Works taking care of sick therefore requesting work excuse until she feels better . Was seen in ED 1 day ago and advised to f/u C/o fatigue and often feels overwhelmed, grieving her loss of a spouse approx 2 years ago still, and still refusing professional help to cope  Review of Systems See HPI C/o fatigue, chills cough, wheeze and increased sputum in the past week Denies sinus pressure, nasal congestion, ear pain or sore throat.  Denies chest pains, palpitations and leg swelling Denies abdominal pain, nausea, vomiting,diarrhea or constipation.   Denies dysuria, frequency, hesitancy or incontinence. Denies joint pain, swelling and limitation in mobility. Denies headaches, seizures, numbness, or tingling.  Denies skin break down or rash.        Objective:   Physical Exam  BP 102/74  Pulse 68  Resp 18  Wt 136 lb 0.6 oz (61.707 kg)  SpO2 94% Patient alert and oriented and in no cardiopulmonary distress.Ill appearing, appears depressed and is easily tearful  HEENT: No facial asymmetry, EOMI, no sinus tenderness,  oropharynx pink and moist.  Neck supple no adenopathy.TM clear  Chest: decreased though adequate air entry iilateral crackles , few wheezes  CVS: S1, S2 no murmurs, no S3.  ABD: Soft non tender. Bowel sounds normal.  Ext: No edema  MS: Adequate ROM spine, shoulders, hips and knees.  Skin: Intact, no ulcerations or rash noted.  Psych: Good eye contact, flat  affect. Memory intact not anxious tearful and  depressed appearing.  CNS: CN 2-12 intact, power, tone and sensation normal  throughout.       Assessment & Plan:  Moderate persistent childhood asthma with acute exacerbation Acute flare of wheeze and cough , short course of steroids and use of a rescue inhaler also work excuse  Seasonal allergies Uncontrolled needs to use nasal spray daily  Acute bronchitis z pack prescribed  ESSENTIAL HYPERTENSION Controlled, no change in medication DASH diet and commitment to daily physical activity for a minimum of 30 minutes discussed and encouraged, as a part of hypertension management. The importance of attaining a healthy weight is also discussed.   Prediabetes Patient educated about the importance of limiting  Carbohydrate intake , the need to commit to daily physical activity for a minimum of 30 minutes , and to commit weight loss. The fact that changes in all these areas will reduce or eliminate all together the development of diabetes is stressed.     Depression Still grieving the unexpected death of her spouse, would benefit from therapy as well as  Short course of antidepressant , continues to refuse both. Neither suicidal or homicidal

## 2013-07-01 NOTE — Telephone Encounter (Signed)
meds sent to [facility] 

## 2013-07-01 NOTE — Telephone Encounter (Signed)
Noted- meds cancelled at [facility]

## 2013-07-01 NOTE — Patient Instructions (Addendum)
F/u as before call if you need me before  Fasting labs whole panel inc HBa1C for June visit, get 3 to 5 days before  You are treated for asthma flare and bronchitis. Three meds are sent in I recommend therapy to help with grief and mild depression, call if you change your mind  It is important that you exercise regularly at least 30 minutes 5 times a week. If you develop chest pain, have severe difficulty breathing, or feel very tired, stop exercising immediately and seek medical attention    A healthy diet is rich in fruit, vegetables and whole grains. Poultry fish, nuts and beans are a healthy choice for protein rather then red meat. A low sodium diet and drinking 64 ounces of water daily is generally recommended. Oils and sweet should be limited. Carbohydrates especially for those who are diabetic or overweight, should be limited to 60-45 gram per meal. It is important to eat on a regular schedule, at least 3 times daily. Snacks should be primarily fruits, vegetables or nuts.

## 2013-07-03 NOTE — ED Provider Notes (Signed)
Medical screening examination/treatment/procedure(s) were performed by non-physician practitioner and as supervising physician I was immediately available for consultation/collaboration.  Flint MelterElliott L Welford Christmas, MD 07/03/13 [phone]

## 2013-07-21 ENCOUNTER — Encounter: Payer: Self-pay | Admitting: Family Medicine

## 2013-07-21 DIAGNOSIS — J209 Acute bronchitis, unspecified: Secondary | ICD-10-CM | POA: Insufficient documentation

## 2013-07-21 DIAGNOSIS — F32A Depression, unspecified: Secondary | ICD-10-CM | POA: Insufficient documentation

## 2013-07-21 DIAGNOSIS — F329 Major depressive disorder, single episode, unspecified: Secondary | ICD-10-CM | POA: Insufficient documentation

## 2013-07-21 NOTE — Assessment & Plan Note (Signed)
Controlled, no change in medication DASH diet and commitment to daily physical activity for a minimum of 30 minutes discussed and encouraged, as a part of hypertension management. The importance of attaining a healthy weight is also discussed.  

## 2013-07-21 NOTE — Assessment & Plan Note (Signed)
z pack prescribed 

## 2013-07-21 NOTE — Assessment & Plan Note (Signed)
Uncontrolled needs to use nasal spray daily

## 2013-07-21 NOTE — Assessment & Plan Note (Signed)
Patient educated about the importance of limiting  Carbohydrate intake , the need to commit to daily physical activity for a minimum of 30 minutes , and to commit weight loss. The fact that changes in all these areas will reduce or eliminate all together the development of diabetes is stressed.    

## 2013-07-21 NOTE — Assessment & Plan Note (Signed)
Still grieving the unexpected death of her spouse, would benefit from therapy as well as  Short course of antidepressant , continues to refuse both. Neither suicidal or homicidal

## 2013-07-21 NOTE — Assessment & Plan Note (Signed)
Acute flare of wheeze and cough , short course of steroids and use of a rescue inhaler also work excuse

## 2013-10-23 ENCOUNTER — Ambulatory Visit (INDEPENDENT_AMBULATORY_CARE_PROVIDER_SITE_OTHER): Payer: 59 | Admitting: Family Medicine

## 2013-10-23 ENCOUNTER — Encounter: Payer: Self-pay | Admitting: Family Medicine

## 2013-10-23 ENCOUNTER — Encounter (INDEPENDENT_AMBULATORY_CARE_PROVIDER_SITE_OTHER): Payer: Self-pay

## 2013-10-23 VITALS — BP 128/80 | HR 82 | Resp 16 | Wt 144.0 lb

## 2013-10-23 DIAGNOSIS — R7309 Other abnormal glucose: Secondary | ICD-10-CM

## 2013-10-23 DIAGNOSIS — E663 Overweight: Secondary | ICD-10-CM

## 2013-10-23 DIAGNOSIS — N952 Postmenopausal atrophic vaginitis: Secondary | ICD-10-CM | POA: Insufficient documentation

## 2013-10-23 DIAGNOSIS — Z139 Encounter for screening, unspecified: Secondary | ICD-10-CM

## 2013-10-23 DIAGNOSIS — R7303 Prediabetes: Secondary | ICD-10-CM

## 2013-10-23 DIAGNOSIS — I10 Essential (primary) hypertension: Secondary | ICD-10-CM

## 2013-10-23 DIAGNOSIS — Z13220 Encounter for screening for lipoid disorders: Secondary | ICD-10-CM

## 2013-10-23 NOTE — Patient Instructions (Addendum)
Annual physical exam  12/4 or after, call if you need me before   CBC, fasting lipid, chem 7, hBA1C, TSH and vit D as sopon as possible please, get result faxed to me or drop it off please  It is important that you exercise regularly at least 30 minutes 5 times a week. If you develop chest pain, have severe difficulty breathing, or feel very tired, stop exercising immediately and seek medical attention  A healthy diet is rich in fruit, vegetables and whole grains. Poultry fish, nuts and beans are a healthy choice for protein rather then red meat. A low sodium diet and drinking 64 ounces of water daily is generally recommended. Oils and sweet should be limited. Carbohydrates especially for those who are diabetic or overweight, should be limited to 60-45 gram per meal. It is important to eat on a regular schedule, at least 3 times daily. Snacks should be primarily fruits, vegetables or nuts.   You need to schedule your colonoscopy, pls call the office of Dr Karilyn Cota  Weight loss goal of 6 to 8 pounds  No changes in medication

## 2013-10-23 NOTE — Assessment & Plan Note (Signed)
Controlled, no change in medication DASH diet and commitment to daily physical activity for a minimum of 30 minutes discussed and encouraged, as a part of hypertension management. The importance of attaining a healthy weight is also discussed.  

## 2013-10-23 NOTE — Assessment & Plan Note (Signed)
Patient educated about the importance of limiting  Carbohydrate intake , the need to commit to daily physical activity for a minimum of 30 minutes , and to commit weight loss. The fact that changes in all these areas will reduce or eliminate all together the development of diabetes is stressed.   Updated lab needed at/ before next visit.  

## 2013-10-23 NOTE — Assessment & Plan Note (Signed)
Improved though persistent vaginal itch, advised use of replens as a lubricant, had negative swabs for infection at last visit

## 2013-10-23 NOTE — Progress Notes (Signed)
   Subjective:    Patient ID: [name] [name], female    DOB: 09/03/1955, 58 y.o.   MRN: [id number]  HPI The PT is here for follow up and re-evaluation of chronic medical conditions, medication management and review of any available recent lab and radiology data.  Preventive health is updated, specifically  Cancer screening and Immunization.   The PT denies any adverse reactions to current medications since the last visit.  Still has some outer vaginal itch though not as severe, relates it to energy drinks. 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 Denies sinus pressure, nasal congestion, ear pain or sore throat. Denies chest congestion, productive cough or wheezing. Denies chest pains, palpitations and leg swelling Denies abdominal pain, nausea, vomiting,diarrhea or constipation.   Denies dysuria, frequency, hesitancy or incontinence. Denies joint pain, swelling and limitation in mobility. Denies headaches, seizures, numbness, or tingling. Denies depression, anxiety or insomnia. Denies skin break down or rash.        Objective:   Physical Exam  BP 128/80  Pulse 82  Resp 16  Wt 144 lb (65.318 kg)  SpO2 99% Patient alert and oriented and in no cardiopulmonary distress.  HEENT: No facial asymmetry, EOMI, no sinus tenderness,  oropharynx pink and moist.  Neck supple no adenopathy.  Chest: Clear to auscultation bilaterally.  CVS: S1, S2 no murmurs, no S3.  ABD: Soft non tender. Bowel sounds normal. Pelvic : not done, pt is post menopausal and reports vaginal dryness, she is not currently sexually active Ext: No edema  MS: Adequate ROM spine, shoulders, hips and knees.  Skin: Intact, no ulcerations or rash noted.  Psych: Good eye contact, normal affect. Memory intact not  anxious or depressed appearing.  CNS: CN 2-12 intact, power, tone and sensation normal throughout.       Assessment & Plan:  ESSENTIAL HYPERTENSION Controlled, no change in medication DASH diet and commitment to daily physical activity for a minimum of 30 minutes discussed and encouraged, as a part of hypertension management. The importance of attaining a healthy weight is also discussed.   Prediabetes Patient educated about the importance of limiting  Carbohydrate intake , the need to commit to daily physical activity for a minimum of 30 minutes , and to commit weight loss. The fact that changes in all these areas will reduce or eliminate all together the development of diabetes is stressed.   Updated lab needed at/ before next visit.   OVERWEIGHT Deteriorated. Patient re-educated about  the importance of commitment to a  minimum of 150 minutes of exercise per week. The importance of healthy food choices with portion control discussed. Encouraged to start a food diary, count calories and to consider  joining a support group. Sample diet sheets offered. Goals set by the patient for the next several months.     Postmenopausal atrophic vaginitis Improved though persistent vaginal itch, advised use of replens as a lubricant, had negative swabs for infection at last visit

## 2013-10-23 NOTE — Assessment & Plan Note (Signed)
Deteriorated. Patient re-educated about  the importance of commitment to a  minimum of 150 minutes of exercise per week. The importance of healthy food choices with portion control discussed. Encouraged to start a food diary, count calories and to consider  joining a support group. Sample diet sheets offered. Goals set by the patient for the next several months.    

## 2013-11-26 ENCOUNTER — Telehealth: Payer: Self-pay

## 2013-11-26 ENCOUNTER — Ambulatory Visit (HOSPITAL_COMMUNITY)
Admission: RE | Admit: 2013-11-26 | Discharge: 2013-11-26 | Disposition: A | Discharge status: Home / Self Care | Payer: 59 | Source: Ambulatory Visit | Attending: Family Medicine | Admitting: Family Medicine

## 2013-11-26 DIAGNOSIS — M79602 Pain in left arm: Secondary | ICD-10-CM

## 2013-11-26 DIAGNOSIS — M79609 Pain in unspecified limb: Secondary | ICD-10-CM | POA: Insufficient documentation

## 2013-11-26 NOTE — Telephone Encounter (Signed)
Agree pls give her work in appt tomorrow also

## 2013-11-26 NOTE — Telephone Encounter (Signed)
Patient aware of results and will come in @ 845 in 7/8

## 2013-11-27 ENCOUNTER — Encounter (INDEPENDENT_AMBULATORY_CARE_PROVIDER_SITE_OTHER): Payer: Self-pay

## 2013-11-27 ENCOUNTER — Encounter: Payer: Self-pay | Admitting: Family Medicine

## 2013-11-27 ENCOUNTER — Ambulatory Visit (INDEPENDENT_AMBULATORY_CARE_PROVIDER_SITE_OTHER): Payer: 59 | Admitting: Family Medicine

## 2013-11-27 VITALS — BP 120/84 | HR 85 | Resp 16 | Wt 146.0 lb

## 2013-11-27 DIAGNOSIS — M67919 Unspecified disorder of synovium and tendon, unspecified shoulder: Secondary | ICD-10-CM

## 2013-11-27 DIAGNOSIS — J302 Other seasonal allergic rhinitis: Secondary | ICD-10-CM

## 2013-11-27 DIAGNOSIS — M7552 Bursitis of left shoulder: Secondary | ICD-10-CM | POA: Insufficient documentation

## 2013-11-27 DIAGNOSIS — M719 Bursopathy, unspecified: Secondary | ICD-10-CM

## 2013-11-27 DIAGNOSIS — I10 Essential (primary) hypertension: Secondary | ICD-10-CM

## 2013-11-27 DIAGNOSIS — J309 Allergic rhinitis, unspecified: Secondary | ICD-10-CM

## 2013-11-27 MED ORDER — IBUPROFEN 800 MG PO TABS: 800.0000 mg | ORAL_TABLET | Freq: Three times a day (TID) | ORAL | Status: DC

## 2013-11-27 MED ORDER — PREDNISONE 5 MG PO TABS: ORAL_TABLET | ORAL | Status: DC

## 2013-11-27 MED ORDER — PSEUDOEPHEDRINE HCL 30 MG PO TABS: ORAL_TABLET | ORAL | Status: DC

## 2013-11-27 MED ORDER — FLUTICASONE PROPIONATE 50 MCG/ACT NA SUSP: 1.0000 | Freq: Every day | NASAL | Status: DC

## 2013-11-27 NOTE — Patient Instructions (Addendum)
F/u as before, call if you need me sooner, if pain persists I will refer you to an orthopedic Doc  You are being treated for bursitis of the left shoulder  Ibuprofen and prednisone are prescribed, take as directed  Please take sudafed one every other day as needed for excess nasal drainage  You still need the colonoscopy  Bursitis Bursitis is when the fluid-filled sac (bursa) that covers and protects a joint gets puffy and irritated. The elbow, shoulder, hip, and knee joints are most often affected. HOME CARE  Put ice on the area.  Put ice in a plastic bag.  Place a towel between your skin and the bag.  Leave the ice on for 15-20 minutes, 03-04 times a day.  Put the joint through a full range of motion 4 times a day. Rest the injured joint at other times. When you have less pain, begin slow movements and usual activities.  Only take medicine as told by your doctor.  Follow up with your doctor. Any delay in care could stop the bursitis from healing. This could cause long-term pain. GET HELP RIGHT AWAY IF:   You have more pain with treatment.  You have a temperature by mouth above 102 F (38.9 C), not controlled by medicine.  You have heat and irritation over the fluid-filled sac. MAKE SURE YOU:   Understand these instructions.  Will watch your condition.  Will get help right away if you are not doing well or get worse. Document Released: 10/27/2009 Document Revised: 08/01/2011 Document Reviewed: 10/27/2009 [facility] Patient Information 2015 [place]. This information is not intended to replace advice given to you by your health care provider. Make sure you discuss any questions you have with your health care provider.

## 2013-11-27 NOTE — Progress Notes (Signed)
   Subjective:    Patient ID: [name] [name], female    DOB: Feb 18, 1956, 58 y.o.   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 Denies , ear pain or sore throat. Denies chest congestion, productive cough or wheezing. Denies chest pains, palpitations and leg swelling Denies abdominal pain, nausea, vomiting,diarrhea or constipation.   Denies dysuria, frequency, hesitancy or incontinence. Denies headaches, seizures, numbness, or tingling. Denies depression, anxiety or insomnia.       Objective:   Physical Exam BP 120/84  Pulse 85  Resp 16  Wt 146 lb (66.225 kg)  SpO2 98% Patient alert and oriented and in no cardiopulmonary distress.  HEENT: No facial asymmetry, EOMI,   oropharynx pink and moist.  Neck supple no JVD, no mass. TM clear, erythema and edema of nasal mucosa Chest: Clear to auscultation bilaterally.  CVS: S1, S2 no murmurs, no S3.Regular rate.  ABD: Soft non tender.   Ext: No edema  MS: Adequate ROM spine,  hips and knees.Ddecreased ROM left shoulder which is tender and swollen  Skin: Intact, no ulcerations or rash noted.    CNS: CN 2-12 intact, power,  normal throughout.no focal deficits noted.        Assessment & Plan:  Bursitis of left shoulder Acute onset of pain swelling and reduced mobility, no direct trauma Oral anti inflammatories prescriebd, pt to call back if no improvement  Seasonal allergies Uncontrolled, add sudafed intermitently,a s needed, one daily  ESSENTIAL HYPERTENSION Controlled, no change in medication DASH diet and commitment to  daily physical activity for a minimum of 30 minutes discussed and encouraged, as a part of hypertension management. The importance of attaining a healthy weight is also discussed.

## 2013-11-30 NOTE — Assessment & Plan Note (Signed)
Uncontrolled, add sudafed intermitently,a s needed, one daily

## 2013-11-30 NOTE — Assessment & Plan Note (Signed)
Controlled, no change in medication DASH diet and commitment to daily physical activity for a minimum of 30 minutes discussed and encouraged, as a part of hypertension management. The importance of attaining a healthy weight is also discussed.  

## 2013-11-30 NOTE — Assessment & Plan Note (Signed)
Acute onset of pain swelling and reduced mobility, no direct trauma Oral anti inflammatories prescriebd, pt to call back if no improvement

## 2014-01-10 ENCOUNTER — Telehealth: Payer: Self-pay | Admitting: *Deleted

## 2014-01-10 MED ORDER — BENAZEPRIL-HYDROCHLOROTHIAZIDE 10-12.5 MG PO TABS: 1.0000 | ORAL_TABLET | Freq: Every day | ORAL | Status: DC

## 2014-01-10 NOTE — Telephone Encounter (Signed)
Pt called stating she needs her bp medicine refilled pt was last here in July, [facility]. Pt would like to have it called in by 9:30 this morning before she left [place]. Pt states she is almost out of bp medicine. [phone] pt would like to be called when medication is called in.

## 2014-01-10 NOTE — Telephone Encounter (Signed)
[facility]. Tried to call patient back but no answer and mailbox was full and unable to leave a message

## 2014-01-13 ENCOUNTER — Other Ambulatory Visit: Payer: Self-pay

## 2014-01-13 ENCOUNTER — Telehealth: Payer: Self-pay | Admitting: Family Medicine

## 2014-01-13 MED ORDER — BENAZEPRIL-HYDROCHLOROTHIAZIDE 10-12.5 MG PO TABS: 1.0000 | ORAL_TABLET | Freq: Every day | ORAL | Status: DC

## 2014-01-13 NOTE — Telephone Encounter (Signed)
Med refilled to requested pharmacy for 30 day supply

## 2014-04-14 ENCOUNTER — Other Ambulatory Visit: Payer: Self-pay | Admitting: Family Medicine

## 2014-04-14 DIAGNOSIS — Z1231 Encounter for screening mammogram for malignant neoplasm of breast: Secondary | ICD-10-CM

## 2014-04-15 ENCOUNTER — Ambulatory Visit: Payer: Self-pay | Admitting: Family Medicine

## 2014-04-16 ENCOUNTER — Ambulatory Visit (HOSPITAL_COMMUNITY): Payer: 59

## 2014-04-25 ENCOUNTER — Encounter: Payer: Self-pay | Admitting: Family Medicine

## 2014-04-25 ENCOUNTER — Encounter: Payer: 59 | Admitting: Family Medicine

## 2014-05-06 ENCOUNTER — Encounter: Payer: Self-pay | Admitting: Family Medicine

## 2015-04-08 ENCOUNTER — Other Ambulatory Visit: Payer: Self-pay | Admitting: Family Medicine

## 2015-04-08 NOTE — Telephone Encounter (Signed)
Patient is asking for a refill on medication benazepril-hydrochlorthiazide (LOTENSIN HCT) 10-12.5 MG per tablet

## 2015-04-15 ENCOUNTER — Other Ambulatory Visit: Payer: Self-pay

## 2015-04-15 MED ORDER — BENAZEPRIL-HYDROCHLOROTHIAZIDE 10-12.5 MG PO TABS: 1.0000 | ORAL_TABLET | Freq: Every day | ORAL | Status: DC

## 2015-05-27 ENCOUNTER — Telehealth: Payer: Self-pay | Admitting: Family Medicine

## 2015-05-27 MED ORDER — BENAZEPRIL-HYDROCHLOROTHIAZIDE 10-12.5 MG PO TABS: 1.0000 | ORAL_TABLET | Freq: Every day | ORAL | Status: DC

## 2015-05-27 NOTE — Telephone Encounter (Signed)
1 refill left

## 2015-05-27 NOTE — Telephone Encounter (Signed)
Patient is calling stating that she only has 2 blood pressure pills left, she is scheduled to see Dr. Lodema HongSimpson next week, please advise?

## 2015-05-29 ENCOUNTER — Ambulatory Visit (INDEPENDENT_AMBULATORY_CARE_PROVIDER_SITE_OTHER): Payer: 59 | Admitting: Family Medicine

## 2015-05-29 VITALS — BP 122/84 | HR 79 | Resp 16 | Ht 62.0 in | Wt 138.0 lb

## 2015-05-29 DIAGNOSIS — I10 Essential (primary) hypertension: Secondary | ICD-10-CM | POA: Diagnosis not present

## 2015-05-29 DIAGNOSIS — Z1159 Encounter for screening for other viral diseases: Secondary | ICD-10-CM

## 2015-05-29 DIAGNOSIS — E663 Overweight: Secondary | ICD-10-CM | POA: Diagnosis not present

## 2015-05-29 DIAGNOSIS — Z1211 Encounter for screening for malignant neoplasm of colon: Secondary | ICD-10-CM | POA: Diagnosis not present

## 2015-05-29 DIAGNOSIS — R7303 Prediabetes: Secondary | ICD-10-CM | POA: Diagnosis not present

## 2015-05-29 DIAGNOSIS — J302 Other seasonal allergic rhinitis: Secondary | ICD-10-CM

## 2015-05-29 DIAGNOSIS — Z13220 Encounter for screening for lipoid disorders: Secondary | ICD-10-CM

## 2015-05-29 MED ORDER — BENAZEPRIL-HYDROCHLOROTHIAZIDE 10-12.5 MG PO TABS: 1.0000 | ORAL_TABLET | Freq: Every day | ORAL | Status: DC

## 2015-05-29 NOTE — Progress Notes (Signed)
Subjective:    Patient ID: [name] [name], female    DOB: [birth year], 58 y.o.   MRN: [id number]  HPI   [name] [name]     MRN: [id number]      DOB: [birth year]   HPI Ms. [name] [name] is here for follow up and re-evaluation of chronic medical conditions, medication management and review of any available recent lab and radiology data.  Preventive health is updated, specifically  Cancer screening and Immunization.   Questions or concerns regarding consultations or procedures which the PT has had in the interim are  addressed. The PT denies any adverse reactions to current medications since the last visit.  There are no new concerns.  There are no specific complaints   ROS Denies recent fever or chills. Denies sinus pressure, nasal congestion, ear pain or sore throat. Denies chest congestion, productive cough or wheezing. Denies chest pains, palpitations and leg swelling Denies abdominal pain, nausea, vomiting,diarrhea or constipation.   Denies dysuria, frequency, hesitancy or incontinence. Denies joint pain, swelling and limitation in mobility. Denies headaches, seizures, numbness, or tingling. Denies depression, anxiety or insomnia. Denies skin break down or rash.   PE  BP 122/84 mmHg  Pulse 79  Resp 16  Ht 5\' 2"  (1.575 m)  Wt 138 lb (62.596 kg)  BMI 25.23 kg/m2  SpO2 100%  Patient alert and oriented and in no cardiopulmonary distress.  HEENT: No facial asymmetry, EOMI,   oropharynx pink and moist.  Neck supple no JVD, no mass.  Chest: Clear to auscultation bilaterally.  CVS: S1, S2 no murmurs, no S3.Regular rate.  ABD: Soft non tender.  No organomegaly or mass,  Rectal: no mass, heme negative stool  Ext: No edema  MS: Adequate ROM spine, shoulders, hips and knees.  Skin: Intact, no ulcerations or rash noted.  Psych: Good eye contact, normal affect. Memory intact not anxious or depressed appearing.  CNS: CN 2-12 intact, power,  normal throughout.no focal deficits  noted.   Assessment & Plan  Essential hypertension con1 DASH diet and commitment to daily physical activity for a minimum of 30 minutes discussed and encouraged, as a part of hypertension management. The importance of attaining a healthy weight is also discussed.  BP/Weight 05/29/2015 11/27/2013 10/23/2013 07/01/2013 06/30/2013 04/24/2013 12/11/2012  Systolic BP 122 120 128 102 110 138 136  Diastolic BP 84 84 80 74 73 86 90  Wt. (Lbs) 138 146 144 136.04 - 147.8 149.8  BMI 25.23 26.7 26.33 24.88 - 27.03 27.39        Overweight Improved Patient re-educated about  the importance of commitment to a  minimum of 150 minutes of exercise per week.  The importance of healthy food choices with portion control discussed. Encouraged to start a food diary, count calories and to consider  joining a support group. Sample diet sheets offered. Goals set by the patient for the next several months.   Weight /BMI 05/29/2015 11/27/2013 10/23/2013  WEIGHT 138 lb 146 lb 144 lb  HEIGHT 5\' 2"  - -  BMI 25.23 kg/m2 26.7 kg/m2 26.33 kg/m2    Current exercise per week 90 minutes.   Prediabetes Updated lab needed at/ before next visit. Patient educated about the importance of limiting  Carbohydrate intake , the need to commit to daily physical activity for a minimum of 30 minutes , and to commit weight loss. The fact that changes in all these areas will reduce or eliminate all together the development of diabetes is stressed.   Diabetic  Labs Latest Ref Rng 11/21/2011 09/28/2011 04/25/2011 12/01/2009 10/22/2008  HbA1c <5.7 % 5.7(H) 5.9(H) - - -  Chol 0 - 200 mg/dL - 696 - 295 284  HDL >13 mg/dL - 62 - 65 66  Calc LDL 0 - 99 mg/dL - 78 - 96 244(W)  Triglycerides <150 mg/dL - 55 - 59 63  Creatinine 0.50 - 1.10 mg/dL 1.02 7.25 3.66 4.40 3.47   BP/Weight 05/29/2015 11/27/2013 10/23/2013 07/01/2013 06/30/2013 04/24/2013 12/11/2012  Systolic BP 122 120 128 102 110 138 136  Diastolic BP 84 84 80 74 73 86 90  Wt. (Lbs) 138 146 144 136.04  - 147.8 149.8  BMI 25.23 26.7 26.33 24.88 - 27.03 27.39   No flowsheet data found.     Seasonal allergies Controlled, no change in medication   Special screening for malignant neoplasms, colon Rectal exam at visit: no mass , heme negative stool. Need for colonoscopy stressed, has still not had one ever, promises to do so       Review of Systems     Objective:   Physical Exam        Assessment & Plan:

## 2015-05-29 NOTE — Patient Instructions (Addendum)
F/u in 8 month, call if you need me sooner  Blood pressure and exam today are good  Start aspirin 81 mg daily to reduce stroke risk  Exercise regularly and work on 8 pound weight loss to improve arthritis  Pains  And general health  Please schedule mammogram  PLEASE GET COLONOSCOPY, you are AGAIN referred to Dr Karilyn Cotaehman as we discussed  Fasting labs past due , please get as soon as possible  [facility], we consider it a privelige to serve you.  All the best for 2017!

## 2015-05-30 ENCOUNTER — Encounter: Payer: Self-pay | Admitting: Family Medicine

## 2015-05-30 DIAGNOSIS — Z1211 Encounter for screening for malignant neoplasm of colon: Secondary | ICD-10-CM | POA: Insufficient documentation

## 2015-05-30 NOTE — Assessment & Plan Note (Signed)
Updated lab needed at/ before next visit. Patient educated about the importance of limiting  Carbohydrate intake , the need to commit to daily physical activity for a minimum of 30 minutes , and to commit weight loss. The fact that changes in all these areas will reduce or eliminate all together the development of diabetes is stressed.   Diabetic Labs Latest Ref Rng 11/21/2011 09/28/2011 04/25/2011 12/01/2009 10/22/2008  HbA1c <5.7 % 5.7(H) 5.9(H) - - -  Chol 0 - 200 mg/dL - 403151 - 474173 259184  HDL >56>39 mg/dL - 62 - 65 66  Calc LDL 0 - 99 mg/dL - 78 - 96 387(F105(H)  Triglycerides <150 mg/dL - 55 - 59 63  Creatinine 0.50 - 1.10 mg/dL 6.430.69 3.290.70 5.180.64 8.410.70 6.600.69   BP/Weight 05/29/2015 11/27/2013 10/23/2013 07/01/2013 06/30/2013 04/24/2013 12/11/2012  Systolic BP 122 120 128 102 110 138 136  Diastolic BP 84 84 80 74 73 86 90  Wt. (Lbs) 138 146 144 136.04 - 147.8 149.8  BMI 25.23 26.7 26.33 24.88 - 27.03 27.39   No flowsheet data found.

## 2015-05-30 NOTE — Assessment & Plan Note (Signed)
Improved Patient re-educated about  the importance of commitment to a  minimum of 150 minutes of exercise per week.  The importance of healthy food choices with portion control discussed. Encouraged to start a food diary, count calories and to consider  joining a support group. Sample diet sheets offered. Goals set by the patient for the next several months.   Weight /BMI 05/29/2015 11/27/2013 10/23/2013  WEIGHT 138 lb 146 lb 144 lb  HEIGHT 5\' 2"  - -  BMI 25.23 kg/m2 26.7 kg/m2 26.33 kg/m2    Current exercise per week 90 minutes.

## 2015-05-30 NOTE — Assessment & Plan Note (Signed)
Rectal exam at visit: no mass , heme negative stool. Need for colonoscopy stressed, has still not had one ever, promises to do so

## 2015-05-30 NOTE — Assessment & Plan Note (Signed)
con1 DASH diet and commitment to daily physical activity for a minimum of 30 minutes discussed and encouraged, as a part of hypertension management. The importance of attaining a healthy weight is also discussed.  BP/Weight 05/29/2015 11/27/2013 10/23/2013 07/01/2013 06/30/2013 04/24/2013 12/11/2012  Systolic BP 122 120 128 102 110 138 136  Diastolic BP 84 84 80 74 73 86 90  Wt. (Lbs) 138 146 144 136.04 - 147.8 149.8  BMI 25.23 26.7 26.33 24.88 - 27.03 27.39

## 2015-05-30 NOTE — Assessment & Plan Note (Signed)
Controlled, no change in medication  

## 2015-06-01 LAB — POC HEMOCCULT BLD/STL (OFFICE/1-CARD/DIAGNOSTIC): FECAL OCCULT BLD: NEGATIVE

## 2015-06-01 NOTE — Addendum Note (Signed)
Addended by: Abner GreenspanHUDY, Onyekachi Gathright H on: 06/01/2015 10:50 AM   Modules accepted: Orders

## 2015-06-03 ENCOUNTER — Other Ambulatory Visit
Admission: RE | Admit: 2015-06-03 | Discharge: 2015-06-03 | Disposition: A | Discharge status: Home / Self Care | Payer: 59 | Source: Ambulatory Visit | Attending: Family Medicine | Admitting: Family Medicine

## 2015-06-03 DIAGNOSIS — I10 Essential (primary) hypertension: Secondary | ICD-10-CM | POA: Insufficient documentation

## 2015-06-03 DIAGNOSIS — Z1159 Encounter for screening for other viral diseases: Secondary | ICD-10-CM | POA: Insufficient documentation

## 2015-06-03 DIAGNOSIS — Z13220 Encounter for screening for lipoid disorders: Secondary | ICD-10-CM | POA: Diagnosis not present

## 2015-06-03 LAB — BASIC METABOLIC PANEL
Anion gap: 5 (ref 5–15)
BUN: 17 mg/dL (ref 6–20)
CO2: 31 mmol/L (ref 22–32)
CREATININE: 0.76 mg/dL (ref 0.44–1.00)
Calcium: 9.4 mg/dL (ref 8.9–10.3)
Chloride: 103 mmol/L (ref 101–111)
GFR calc Af Amer: >60 mL/min (ref >60)
GLUCOSE: 99 mg/dL (ref 65–99)
POTASSIUM: 3.5 mmol/L (ref 3.5–5.1)
Sodium: 139 mmol/L (ref 135–145)

## 2015-06-03 LAB — LIPID PANEL
CHOL/HDL RATIO: 2.5 ratio
CHOLESTEROL: 167 mg/dL (ref 0–200)
HDL: 66 mg/dL (ref >40)
LDL Cholesterol: 94 mg/dL (ref 0–99)
TRIGLYCERIDES: 33 mg/dL (ref <150)
VLDL: 7 mg/dL (ref 0–40)

## 2015-06-03 LAB — CBC WITH DIFFERENTIAL/PLATELET
Basophils Absolute: 0.1 10*3/uL (ref 0–0.1)
Basophils Relative: 1 %
EOS ABS: 0.1 10*3/uL (ref 0–0.7)
EOS PCT: 2 %
HCT: 38.5 % (ref 35.0–47.0)
Hemoglobin: 12.9 g/dL (ref 12.0–16.0)
LYMPHS ABS: 1.3 10*3/uL (ref 1.0–3.6)
LYMPHS PCT: 23 %
MCH: 26.6 pg (ref 26.0–34.0)
MCHC: 33.5 g/dL (ref 32.0–36.0)
MCV: 79.5 fL — AB (ref 80.0–100.0)
MONO ABS: 0.2 10*3/uL (ref 0.2–0.9)
MONOS PCT: 4 %
Neutro Abs: 3.8 10*3/uL (ref 1.4–6.5)
Neutrophils Relative %: 70 %
PLATELETS: 466 10*3/uL — AB (ref 150–440)
RBC: 4.84 MIL/uL (ref 3.80–5.20)
RDW: 14.7 % — ABNORMAL HIGH (ref 11.5–14.5)
WBC: 5.4 10*3/uL (ref 3.6–11.0)

## 2015-06-03 LAB — TSH: TSH: 4.035 u[IU]/mL (ref 0.350–4.500)

## 2015-06-03 LAB — RAPID HIV SCREEN (HIV 1/2 AB+AG)
HIV 1/2 Antibodies: NONREACTIVE
HIV-1 P24 Antigen - HIV24: NONREACTIVE

## 2015-06-04 ENCOUNTER — Other Ambulatory Visit: Payer: Self-pay

## 2015-06-04 ENCOUNTER — Ambulatory Visit: Payer: 59 | Admitting: Family Medicine

## 2015-06-04 ENCOUNTER — Encounter (INDEPENDENT_AMBULATORY_CARE_PROVIDER_SITE_OTHER): Payer: Self-pay | Admitting: *Deleted

## 2015-06-04 LAB — VITAMIN D 25 HYDROXY (VIT D DEFICIENCY, FRACTURES): Vit D, 25-Hydroxy: 10.2 ng/mL — ABNORMAL LOW (ref 30.0–100.0)

## 2015-06-04 LAB — HEPATITIS C ANTIBODY: HCV Ab: <0.1 s/co ratio (ref 0.0–0.9)

## 2015-06-04 MED ORDER — VITAMIN D (ERGOCALCIFEROL) 1.25 MG (50000 UNIT) PO CAPS: 50000.0000 [IU] | ORAL_CAPSULE | ORAL | Status: DC

## 2015-06-09 ENCOUNTER — Other Ambulatory Visit: Payer: Self-pay

## 2015-06-09 ENCOUNTER — Telehealth: Payer: Self-pay

## 2015-06-09 MED ORDER — IBUPROFEN 600 MG PO TABS: ORAL_TABLET | ORAL | Status: DC

## 2015-06-09 MED ORDER — CYCLOBENZAPRINE HCL 10 MG PO TABS: ORAL_TABLET | ORAL | Status: DC

## 2015-06-09 NOTE — Telephone Encounter (Signed)
meds prescibed, flexeril and ibuprofen 600 mg . Advise her to stop what she is taken and fill script  Call back for appt if still persists

## 2015-06-09 NOTE — Telephone Encounter (Signed)
Pt aware and meds sent  

## 2015-06-09 NOTE — Telephone Encounter (Signed)
Attempted to reach patient and unable to leave message.  Voicemail box if full.  Will try again.

## 2015-09-08 ENCOUNTER — Telehealth: Payer: Self-pay | Admitting: Family Medicine

## 2015-09-08 MED ORDER — BENAZEPRIL-HYDROCHLOROTHIAZIDE 10-12.5 MG PO TABS: 1.0000 | ORAL_TABLET | Freq: Every day | ORAL | Status: DC

## 2015-09-08 NOTE — Telephone Encounter (Signed)
Patient is calling needing a refill on benazepril-hydrochlorthiazide (LOTENSIN HCT) 10-12.5 MG tablet

## 2015-09-08 NOTE — Telephone Encounter (Signed)
Medication refilled

## 2015-09-25 ENCOUNTER — Ambulatory Visit (INDEPENDENT_AMBULATORY_CARE_PROVIDER_SITE_OTHER): Payer: 59 | Admitting: Family Medicine

## 2015-09-25 ENCOUNTER — Encounter: Payer: Self-pay | Admitting: Family Medicine

## 2015-09-25 VITALS — BP 138/72 | HR 78 | Temp 98.6°F | Resp 18 | Ht 62.0 in | Wt 141.0 lb

## 2015-09-25 DIAGNOSIS — J302 Other seasonal allergic rhinitis: Secondary | ICD-10-CM | POA: Diagnosis not present

## 2015-09-25 DIAGNOSIS — F32A Depression, unspecified: Secondary | ICD-10-CM

## 2015-09-25 DIAGNOSIS — E559 Vitamin D deficiency, unspecified: Secondary | ICD-10-CM

## 2015-09-25 DIAGNOSIS — R7303 Prediabetes: Secondary | ICD-10-CM

## 2015-09-25 DIAGNOSIS — M25511 Pain in right shoulder: Secondary | ICD-10-CM | POA: Diagnosis not present

## 2015-09-25 DIAGNOSIS — Z1211 Encounter for screening for malignant neoplasm of colon: Secondary | ICD-10-CM

## 2015-09-25 DIAGNOSIS — I10 Essential (primary) hypertension: Secondary | ICD-10-CM

## 2015-09-25 DIAGNOSIS — F329 Major depressive disorder, single episode, unspecified: Secondary | ICD-10-CM

## 2015-09-25 DIAGNOSIS — E663 Overweight: Secondary | ICD-10-CM

## 2015-09-25 MED ORDER — LORATADINE 10 MG PO TABS: 10.0000 mg | ORAL_TABLET | Freq: Every day | ORAL | Status: DC

## 2015-09-25 NOTE — Progress Notes (Signed)
Subjective:    Patient ID: [name] [name], female    DOB: 09-10-1955, 60 y.o.   [name]: [id number]  HPI   [name] [name]     [name]: [id number]      DOB: 09-10-1955   HPI [name] [name] is here for 2 day h/o excess nasal drainage and scratchy throat, she denies fever , chills or yellow sputum. 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 ROS Denies recent fever or chills. . Denies chest congestion, productive cough or wheezing. Denies chest pains, palpitations and leg swelling Denies abdominal pain, nausea, vomiting,diarrhea or constipation.   Denies dysuria, frequency, hesitancy or incontinence. Mild depression due to loneliness since spouse passed, denies anxiety and insomnia Denies headache Denies skin break down or rash.   PE  BP 138/72 mmHg  Pulse 78  Temp(Src) 98.6 F (37 C)  Resp 18  Ht 5\' 2"  (1.575 m)  Wt 141 lb (63.957 kg)  BMI 25.78 kg/m2  SpO2 96%  Patient alert and oriented and in no cardiopulmonary distress.  HEENT: No facial asymmetry, EOMI,   oropharynx pink and moist.  Neck supple no JVD, no mass.No sinus tenderness,  TM clear   Chest: Clear to auscultation bilaterally.  CVS: S1, S2 no murmurs, no S3.Regular rate.  ABD: Soft non tender.   Ext: No edema  MS: Adequate ROM spine, shoulders, hips and knees.  Skin: Intact, no ulcerations or rash noted.  Psych: Good eye contact, normal affect. Memory intact not anxious or depressed appearing.  CNS: CN 2-12 intact, power,  normal throughout.no focal deficits noted.   Assessment & Plan  Seasonal allergies Currently uncontrolled and symptomatic , start daily meds, salt water gargle and honey for sore throat x 3 days  Shoulder pain, right Worsenign/ progressive symptoms , refer ortho to  eval  Overweight Deteriorated. Patient re-educated about  the importance of commitment to a  minimum of 150 minutes of exercise per week.  The importance of healthy food choices with portion control discussed. Encouraged to start a food diary, count calories and to consider  joining a support group. Sample diet sheets offered. Goals set by the patient for the next several months.   Weight /BMI 09/25/2015 05/29/2015 11/27/2013  WEIGHT 141 lb 138 lb 146 lb  HEIGHT 5\' 2"  5\' 2"  -  BMI 25.78 kg/m2 25.23 kg/m2 26.7 kg/m2    Current exercise per week 90 minutes.   Essential hypertension Controlled, no change in medication DASH diet and commitment to daily physical activity for a minimum of 30 minutes discussed and encouraged, as a part of hypertension management. The importance of attaining a healthy weight is also discussed.  BP/Weight 09/25/2015 05/29/2015 11/27/2013 10/23/2013 07/01/2013 06/30/2013 04/24/2013  Systolic BP 138 122 120 128 102 110 138  Diastolic BP 72 84 84 80 74 73 86  Wt. (Lbs) 141 138 146 144 136.04 - 147.8  BMI 25.78 25.23 26.7 26.33 24.88 - 27.03        Special screening for malignant neoplasms, colon Still not yet done, states she is having this done after another family member , who is having hers done today, importance of test again re iterated  Depression Mild depression , not suicidal or homicidal, widowed at a young age and lonely      Review of Systems  Objective:   Physical Exam        Assessment & Plan:

## 2015-09-25 NOTE — Patient Instructions (Addendum)
Keep Sept 6 appt , call if you need me before  Start daily allergy mediation, salt water gargle twice daily and tsp honey daily for next 3 days  You are referred to Dr Romeo AppleHarrison re right shoulder  Please work on good  health habits so that your health will improve. 1. Commitment to daily physical activity for 30 to 60  minutes, if you are able to do this.  2. Commitment to wise food choices. Aim for half of your  food intake to be vegetable and fruit, one quarter starchy foods, and one quarter protein. Try to eat on a regular schedule  3 meals per day, snacking between meals should be limited to vegetables or fruits or small portions of nuts. 64 ounces of water per day is generally recommended, unless you have specific health conditions, like heart failure or kidney failure where you will need to limit fluid intake.  3. Commitment to sufficient and a  good quality of physical and mental rest daily, generally between 6 to 8 hours per day.  WITH PERSISTANCE AND PERSEVERANCE, THE IMPOSSIBLE , BECOMES THE NORM!   Non fast chem 7 HBa1C, vit D  Thanks for choosing [facility], we consider it a privelige to serve you.

## 2015-09-27 NOTE — Assessment & Plan Note (Signed)
Currently uncontrolled and symptomatic , start daily meds, salt water gargle and honey for sore throat x 3 days

## 2015-09-27 NOTE — Assessment & Plan Note (Signed)
Mild depression , not suicidal or homicidal, widowed at a young age and lonely

## 2015-09-27 NOTE — Assessment & Plan Note (Signed)
Still not yet done, states she is having this done after another family member , who is having hers done today, importance of test again re iterated

## 2015-09-27 NOTE — Assessment & Plan Note (Signed)
Controlled, no change in medication DASH diet and commitment to daily physical activity for a minimum of 30 minutes discussed and encouraged, as a part of hypertension management. The importance of attaining a healthy weight is also discussed.  BP/Weight 09/25/2015 05/29/2015 11/27/2013 10/23/2013 07/01/2013 06/30/2013 04/24/2013  Systolic BP 138 122 120 128 102 110 138  Diastolic BP 72 84 84 80 74 73 86  Wt. (Lbs) 141 138 146 144 136.04 - 147.8  BMI 25.78 25.23 26.7 26.33 24.88 - 27.03

## 2015-09-27 NOTE — Assessment & Plan Note (Signed)
Worsenign/ progressive symptoms , refer ortho to eval

## 2015-09-27 NOTE — Assessment & Plan Note (Signed)
Deteriorated. Patient re-educated about  the importance of commitment to a  minimum of 150 minutes of exercise per week.  The importance of healthy food choices with portion control discussed. Encouraged to start a food diary, count calories and to consider  joining a support group. Sample diet sheets offered. Goals set by the patient for the next several months.   Weight /BMI 09/25/2015 05/29/2015 11/27/2013  WEIGHT 141 lb 138 lb 146 lb  HEIGHT 5\' 2"  5\' 2"  -  BMI 25.78 kg/m2 25.23 kg/m2 26.7 kg/m2    Current exercise per week 90 minutes.

## 2015-09-28 ENCOUNTER — Telehealth: Payer: Self-pay | Admitting: Family Medicine

## 2015-09-28 MED ORDER — PROMETHAZINE-DM 6.25-15 MG/5ML PO SYRP: ORAL_SOLUTION | ORAL | Status: DC

## 2015-09-28 MED ORDER — PREDNISONE 5 MG (21) PO TBPK: 5.0000 mg | ORAL_TABLET | ORAL | Status: DC

## 2015-09-28 NOTE — Telephone Encounter (Signed)
Without fever, antibiotics hurt not help, no fever, no infection that needs antibiotic, any other symptoms???

## 2015-09-28 NOTE — Telephone Encounter (Signed)
Patient has started Claritin. States that cough is the major problem with clear sputum.  Is asking for cough syrup for night and steroid if agreed. Please advise.

## 2015-09-28 NOTE — Telephone Encounter (Signed)
Patient is stating that shes not feeling much better, she is complaining of a persistant bad cough and she is asking if Dr. Lodema HongSimpson would call her in an antibiotic [facility] please advise?

## 2015-09-28 NOTE — Telephone Encounter (Signed)
Patient is asking for abt.   States that she has this problem every year.   No fever noted.  Please advise.

## 2015-09-28 NOTE — Telephone Encounter (Signed)
Meds [facility] to [facility] pls let her know

## 2015-09-28 NOTE — Telephone Encounter (Signed)
Patient aware.

## 2015-09-28 NOTE — Addendum Note (Signed)
Addended by: Syliva OvermanSIMPSON, MARGARET E on: 09/28/2015 12:06 PM   Modules accepted: Orders

## 2015-09-30 ENCOUNTER — Telehealth: Payer: Self-pay | Admitting: Family Medicine

## 2015-09-30 DIAGNOSIS — R05 Cough: Secondary | ICD-10-CM | POA: Diagnosis not present

## 2015-09-30 DIAGNOSIS — R058 Other specified cough: Secondary | ICD-10-CM

## 2015-09-30 NOTE — Telephone Encounter (Signed)
Reviewed with patient antibiotic resistance and the need for diagnostic test to prove infection before prescribing.  She is asking for a sputum culture.  Do you agree?   She is did not want to have a CXR.

## 2015-09-30 NOTE — Addendum Note (Signed)
Addended by: Kandis FantasiaSLADE, COURTNEY B on: 09/30/2015 03:26 PM   Modules accepted: Orders

## 2015-09-30 NOTE — Telephone Encounter (Signed)
Order placed

## 2015-09-30 NOTE — Telephone Encounter (Signed)
Patient is asking to speak to Dr. Lodema HongSimpson, she states that she has a persistent cough, she cant get any rest due to the cough, shes tired, please advise?

## 2015-09-30 NOTE — Telephone Encounter (Signed)
pls order I will sign 

## 2015-09-30 NOTE — Telephone Encounter (Signed)
Called and left message for patient to return call.  

## 2015-10-02 ENCOUNTER — Other Ambulatory Visit: Payer: Self-pay

## 2015-10-02 ENCOUNTER — Telehealth: Payer: Self-pay

## 2015-10-02 MED ORDER — PENICILLIN V POTASSIUM 500 MG PO TABS: 500.0000 mg | ORAL_TABLET | Freq: Three times a day (TID) | ORAL | Status: DC

## 2015-10-02 MED ORDER — ALBUTEROL SULFATE HFA 108 (90 BASE) MCG/ACT IN AERS: 2.0000 | INHALATION_SPRAY | Freq: Four times a day (QID) | RESPIRATORY_TRACT | Status: DC | PRN

## 2015-10-02 NOTE — Telephone Encounter (Signed)
Patient aware and [facility]

## 2015-10-02 NOTE — Telephone Encounter (Signed)
Sputum is showing gm sso cocci uin prs, still not complete but SUGGESTS mAY have bacterial infection. I have entered 1 week course of penicillin, pls send after you speak with ehr , cough suppressant syrup and prednisone were  already sent,earlier this week pls check that she has both  albuterol MDI for as needed use also entered pls send after you spk with her

## 2015-10-04 LAB — RESPIRATORY CULTURE OR RESPIRATORY AND SPUTUM CULTURE: ORGANISM ID, BACTERIA: NORMAL

## 2015-10-05 ENCOUNTER — Telehealth: Payer: Self-pay

## 2015-10-05 NOTE — Telephone Encounter (Signed)
Needs to go to Ed or urgent care for furhter evaluation as I am unable to see her today, and based on her symptom of too weak to work she needs clinical eval today  I CANNOT provide 1 week work excuse based on my clinical judgement she needs urgent care / ED eval  Was referred to Dr Romeo AppleHarrison about her shoulder she needs to check on that as well

## 2015-10-05 NOTE — Telephone Encounter (Signed)
Patient aware of advice.  States that she does not want to go to another doctor.   She will call back in the am if she would like to be seen then.  Aware of results.   States that she is just weak and does not want to go to work.

## 2015-10-05 NOTE — Telephone Encounter (Signed)
Called and left message for patient to return call.  

## 2015-10-06 ENCOUNTER — Ambulatory Visit (INDEPENDENT_AMBULATORY_CARE_PROVIDER_SITE_OTHER): Payer: 59 | Admitting: Family Medicine

## 2015-10-06 ENCOUNTER — Encounter: Payer: Self-pay | Admitting: Family Medicine

## 2015-10-06 VITALS — BP 122/64 | HR 80 | Temp 98.4°F | Resp 18 | Ht 62.0 in | Wt 139.0 lb

## 2015-10-06 DIAGNOSIS — J209 Acute bronchitis, unspecified: Secondary | ICD-10-CM

## 2015-10-06 DIAGNOSIS — I10 Essential (primary) hypertension: Secondary | ICD-10-CM

## 2015-10-06 DIAGNOSIS — J302 Other seasonal allergic rhinitis: Secondary | ICD-10-CM

## 2015-10-06 DIAGNOSIS — E559 Vitamin D deficiency, unspecified: Secondary | ICD-10-CM | POA: Diagnosis not present

## 2015-10-06 DIAGNOSIS — R7309 Other abnormal glucose: Secondary | ICD-10-CM | POA: Diagnosis not present

## 2015-10-06 DIAGNOSIS — R7303 Prediabetes: Secondary | ICD-10-CM | POA: Diagnosis not present

## 2015-10-06 LAB — HEMOGLOBIN A1C
Hgb A1c MFr Bld: 5.7 % — ABNORMAL HIGH (ref <5.7)
Mean Plasma Glucose: 117 mg/dL

## 2015-10-06 LAB — CBC WITH DIFFERENTIAL/PLATELET
BASOS ABS: 73 {cells}/uL (ref 0–200)
Basophils Relative: 1 %
Eosinophils Absolute: 365 cells/uL (ref 15–500)
Eosinophils Relative: 5 %
HEMATOCRIT: 40.6 % (ref 35.0–45.0)
HEMOGLOBIN: 13.2 g/dL (ref 11.7–15.5)
LYMPHS ABS: 2044 {cells}/uL (ref 850–3900)
Lymphocytes Relative: 28 %
MCH: 26.9 pg — AB (ref 27.0–33.0)
MCHC: 32.5 g/dL (ref 32.0–36.0)
MCV: 82.9 fL (ref 80.0–100.0)
MONO ABS: 219 {cells}/uL (ref 200–950)
MPV: 8.8 fL (ref 7.5–12.5)
Monocytes Relative: 3 %
NEUTROS ABS: 4599 {cells}/uL (ref 1500–7800)
NEUTROS PCT: 63 %
Platelets: 532 10*3/uL — ABNORMAL HIGH (ref 140–400)
RBC: 4.9 MIL/uL (ref 3.80–5.10)
RDW: 14.6 % (ref 11.0–15.0)
WBC: 7.3 10*3/uL (ref 3.8–10.8)

## 2015-10-06 NOTE — Patient Instructions (Signed)
F/u in September as before, call if you need me sooner  CXr and labs at [facility] today please  Complete entire antibiotic course, get rest and drink a lot of fluids  Work excuse start 5/15 return 10/12/2015  Thankful you are starting to feel better

## 2015-10-06 NOTE — Progress Notes (Signed)
   Subjective:    Patient ID: [name] [name], female    DOB: Feb 05, 1956, 60 y.o.   MRN: [id number]  HPI Pt in today stating that she continues to feel weak from her illness which started on 5/3. Initially peresented on May 5. Reports 3 day work week, and took vacation the week of 5/8 Called in yesterday stating too weak to work, cough is improved but persists, sputum is clear, just able to rest at night over the past weekend, with cough suppressant. HAS HAD TO USE INHALER LESS FREQUENTLY, this week only once No sinus symptoms , scant sputum production, but a lot of chest congestion, and sputum is thick when it is produced  Review of Systems See HPI . Denies chest pains, palpitations and leg swelling Denies abdominal pain, nausea, vomiting,diarrhea or constipation.   Denies dysuria, frequency, hesitancy or incontinence. Denies joint pain, swelling and limitation in mobility. Denies headaches, seizures, numbness, or tingling. Denies depression, anxiety or insomnia. Denies skin break down or rash.      Objective:   Physical Exam  BP 122/64 mmHg  Pulse 80  Temp(Src) 98.4 F (36.9 C)  Resp 18  Ht 5\' 2"  (1.575 m)  Wt 139 lb (63.05 kg)  BMI 25.42 kg/m2  SpO2 100% Patient alert and oriented and in no cardiopulmonary distress.Ill appeaing  HEENT: No facial asymmetry, EOMI,   oropharynx pink and moist.  Neck supple no JVD, no mass. No sinus tenderness, TM clear, oropharynx not erythematous , no exudate Chest: Adequate air entry bilaterally, few crackles, no wheezes.  CVS: S1, S2 no murmurs, no S3.Regular rate.  ABD: Soft non tender.   Ext: No edema  MS: Adequate ROM spine, shoulders, hips and knees.  Skin: Intact, no ulcerations or rash noted.  Psych: Good eye contact, normal affect. Memory intact not anxious or depressed appearing.  CNS: CN 2-12 intact, power,  normal throughout.no focal deficits noted.       Assessment & Plan:  Acute bronchitis Antibiotic, and  decongestant prescribed, CXR today and work excuse  Essential hypertension Controlled, no change in medication DASH diet and commitment to daily physical activity for a minimum of 30 minutes discussed and encouraged, as a part of hypertension management. The importance of attaining a healthy weight is also discussed.  BP/Weight 10/06/2015 09/25/2015 05/29/2015 11/27/2013 10/23/2013 07/01/2013 06/30/2013  Systolic BP 122 138 122 120 128 102 110  Diastolic BP 64 72 84 84 80 74 73  Wt. (Lbs) 139 141 138 146 144 136.04 -  BMI 25.42 25.78 25.23 26.7 26.33 24.88 -        Seasonal allergies Currently uncontrolled but improving since last visit

## 2015-10-07 LAB — BASIC METABOLIC PANEL
BUN: 15 mg/dL (ref 7–25)
CHLORIDE: 101 mmol/L (ref 98–110)
CO2: 28 mmol/L (ref 20–31)
Calcium: 9 mg/dL (ref 8.6–10.4)
Creat: 0.6 mg/dL (ref 0.50–1.05)
GLUCOSE: 77 mg/dL (ref 65–99)
POTASSIUM: 3.9 mmol/L (ref 3.5–5.3)
SODIUM: 139 mmol/L (ref 135–146)

## 2015-10-07 LAB — VITAMIN D 25 HYDROXY (VIT D DEFICIENCY, FRACTURES): VIT D 25 HYDROXY: 37 ng/mL (ref 30–100)

## 2015-10-12 NOTE — Assessment & Plan Note (Signed)
Antibiotic, and decongestant prescribed, CXR today and work excuse

## 2015-10-12 NOTE — Assessment & Plan Note (Signed)
Controlled, no change in medication DASH diet and commitment to daily physical activity for a minimum of 30 minutes discussed and encouraged, as a part of hypertension management. The importance of attaining a healthy weight is also discussed.  BP/Weight 10/06/2015 09/25/2015 05/29/2015 11/27/2013 10/23/2013 07/01/2013 06/30/2013  Systolic BP 122 138 122 120 128 102 110  Diastolic BP 64 72 84 84 80 74 73  Wt. (Lbs) 139 141 138 146 144 136.04 -  BMI 25.42 25.78 25.23 26.7 26.33 24.88 -

## 2015-10-12 NOTE — Assessment & Plan Note (Signed)
Currently uncontrolled but improving since last visit

## 2015-12-31 ENCOUNTER — Other Ambulatory Visit: Payer: Self-pay | Admitting: Family Medicine

## 2015-12-31 ENCOUNTER — Other Ambulatory Visit: Payer: Self-pay

## 2015-12-31 ENCOUNTER — Telehealth: Payer: Self-pay | Admitting: Family Medicine

## 2015-12-31 MED ORDER — OLOPATADINE HCL 0.1 % OP SOLN: 1.0000 [drp] | Freq: Two times a day (BID) | OPHTHALMIC | 1 refills | Status: DC

## 2015-12-31 NOTE — Telephone Encounter (Signed)
Entered , pls send to the pharmacy of her choice

## 2015-12-31 NOTE — Telephone Encounter (Signed)
Liborio NixonJanice is c/o itching/watery eyes, she is using otc drops but she is asking for a Rx from Dr. Lodema HongSimpson

## 2015-12-31 NOTE — Telephone Encounter (Signed)
Patient is asking for prescription allergy drops.  Please advise.

## 2016-01-27 ENCOUNTER — Ambulatory Visit: Payer: 59 | Admitting: Family Medicine

## 2016-02-16 ENCOUNTER — Ambulatory Visit: Payer: 59 | Admitting: Family Medicine

## 2016-03-02 ENCOUNTER — Encounter: Payer: Self-pay | Admitting: Family Medicine

## 2016-03-02 ENCOUNTER — Ambulatory Visit (INDEPENDENT_AMBULATORY_CARE_PROVIDER_SITE_OTHER): Payer: 59 | Admitting: Family Medicine

## 2016-03-02 VITALS — BP 122/82 | HR 86 | Ht 62.0 in | Wt 143.1 lb

## 2016-03-02 DIAGNOSIS — R7303 Prediabetes: Secondary | ICD-10-CM

## 2016-03-02 DIAGNOSIS — I10 Essential (primary) hypertension: Secondary | ICD-10-CM | POA: Diagnosis not present

## 2016-03-02 DIAGNOSIS — E559 Vitamin D deficiency, unspecified: Secondary | ICD-10-CM

## 2016-03-02 DIAGNOSIS — M79606 Pain in leg, unspecified: Secondary | ICD-10-CM

## 2016-03-02 DIAGNOSIS — J45909 Unspecified asthma, uncomplicated: Secondary | ICD-10-CM

## 2016-03-02 DIAGNOSIS — Z1211 Encounter for screening for malignant neoplasm of colon: Secondary | ICD-10-CM

## 2016-03-02 DIAGNOSIS — M549 Dorsalgia, unspecified: Secondary | ICD-10-CM

## 2016-03-02 DIAGNOSIS — E663 Overweight: Secondary | ICD-10-CM

## 2016-03-02 DIAGNOSIS — J302 Other seasonal allergic rhinitis: Secondary | ICD-10-CM

## 2016-03-02 NOTE — Patient Instructions (Signed)
Annual exam in mid December with pap, call if you need me sooner  Leg pain , I bel;ieve is from arthritis in your ;low back, X ray is ordered to have done at [place]  Pls sched and get your mammogram,  Need colonoscopy ,  You will be referred  Labs 1 week before f/u hBA1C, chem 7 and Vit D  Thank you  for choosing [facility]. We consider it a privelige to serve you.  Delivering excellent health care in a caring and  compassionate way is our goal.  Partnering with you,  so that together we can achieve this goal is our strategy.

## 2016-03-06 ENCOUNTER — Encounter: Payer: Self-pay | Admitting: Family Medicine

## 2016-03-06 DIAGNOSIS — J45909 Unspecified asthma, uncomplicated: Secondary | ICD-10-CM | POA: Insufficient documentation

## 2016-03-06 DIAGNOSIS — M79606 Pain in leg, unspecified: Secondary | ICD-10-CM | POA: Insufficient documentation

## 2016-03-06 NOTE — Assessment & Plan Note (Signed)
Patient educated about the importance of limiting  Carbohydrate intake , the need to commit to daily physical activity for a minimum of 30 minutes , and to commit weight loss. The fact that changes in all these areas will reduce or eliminate all together the development of diabetes is stressed.  Updated lab needed at/ before next visit.   Diabetic Labs Latest Ref Rng & Units 10/06/2015 06/03/2015 11/21/2011 09/28/2011 04/25/2011  HbA1c <5.7 % 5.7(H) - 5.7(H) 5.9(H) -  Chol 0 - 200 mg/dL - 161167 - 096151 -  HDL >04>40 mg/dL - 66 - 62 -  Calc LDL 0 - 99 mg/dL - 94 - 78 -  Triglycerides <150 mg/dL - 33 - 55 -  Creatinine 0.50 - 1.05 mg/dL 5.400.60 9.810.76 1.910.69 4.780.70 2.950.64   BP/Weight 03/02/2016 10/06/2015 09/25/2015 05/29/2015 11/27/2013 10/23/2013 07/01/2013  Systolic BP 122 122 138 122 120 128 102  Diastolic BP 82 64 72 84 84 80 74  Wt. (Lbs) 143.12 139 141 138 146 144 136.04  BMI 26.18 25.42 25.78 25.23 26.7 26.33 24.88   No flowsheet data found.

## 2016-03-06 NOTE — Assessment & Plan Note (Signed)
Controlled, no change in medication  

## 2016-03-06 NOTE — Assessment & Plan Note (Signed)
Deteriorated. Patient re-educated about  the importance of commitment to a  minimum of 150 minutes of exercise per week.  The importance of healthy food choices with portion control discussed. Encouraged to start a food diary, count calories and to consider  joining a support group. Sample diet sheets offered. Goals set by the patient for the next several months.   Weight /BMI 03/02/2016 10/06/2015 09/25/2015  WEIGHT 143 lb 1.9 oz 139 lb 141 lb  HEIGHT 5\' 2"  5\' 2"  5\' 2"   BMI 26.18 kg/m2 25.42 kg/m2 25.78 kg/m2

## 2016-03-06 NOTE — Progress Notes (Signed)
[name] [name]     MRN: [id number]      DOB: Jan 24, 1956   HPI [name] [name] is here for follow up and re-evaluation of chronic medical conditions, medication management and review of any available recent lab and radiology data.  Preventive health is updated, specifically  Cancer screening and Immunization.   Questions or concerns regarding consultations or procedures which the PT has had in the interim are  addressed. The PT denies any adverse reactions to current medications since the last visit.  C/o bilateral lower extremity pain and intermittent back pain  ROS Denies recent fever or chills. Denies sinus pressure, nasal congestion, ear pain or sore throat. Denies chest congestion, productive cough or wheezing. Denies chest pains, palpitations and leg swelling Denies abdominal pain, nausea, vomiting,diarrhea or constipation.   Denies dysuria, frequency, hesitancy or incontinence.  Denies headaches, seizures, numbness, or tingling. Denies depression, anxiety or insomnia. Denies skin break down or rash.   PE  BP 122/82 (BP Location: Left Arm, Patient Position: Sitting, Cuff Size: Normal)   Pulse 86   Ht 5\' 2"  (1.575 m)   Wt 143 lb 1.9 oz (64.9 kg)   SpO2 98%   BMI 26.18 kg/m   Patient alert and oriented and in no cardiopulmonary distress.  HEENT: No facial asymmetry, EOMI,   oropharynx pink and moist.  Neck supple no JVD, no mass.  Chest: Clear to auscultation bilaterally.  CVS: S1, S2 no murmurs, no S3.Regular rate.  ABD: Soft non tender.   Ext: No edema  MS: Adequate though reduced  ROM spine, adequate in  Skin: Intact, no ulcerations or rash noted.  Psych: Good eye contact, normal affect. Memory intact not anxious or depressed appearing.  CNS: CN 2-12 intact, power,  normal throughout.no focal deficits noted.   Assessment & Plan  Essential hypertension Controlled, no change in medication DASH diet and commitment to daily physical activity for a minimum of 30  minutes discussed and encouraged, as a part of hypertension management. The importance of attaining a healthy weight is also discussed.  BP/Weight 03/02/2016 10/06/2015 09/25/2015 05/29/2015 11/27/2013 10/23/2013 07/01/2013  Systolic BP 122 122 138 122 120 128 102  Diastolic BP 82 64 72 84 84 80 74  Wt. (Lbs) 143.12 139 141 138 146 144 136.04  BMI 26.18 25.42 25.78 25.23 26.7 26.33 24.88       Back pain with radiation Increased and uncontrolled , x ray of lumbar spine. Back exercises and weight loss encouraged  Overweight Deteriorated. Patient re-educated about  the importance of commitment to a  minimum of 150 minutes of exercise per week.  The importance of healthy food choices with portion control discussed. Encouraged to start a food diary, count calories and to consider  joining a support group. Sample diet sheets offered. Goals set by the patient for the next several months.   Weight /BMI 03/02/2016 10/06/2015 09/25/2015  WEIGHT 143 lb 1.9 oz 139 lb 141 lb  HEIGHT 5\' 2"  5\' 2"  5\' 2"   BMI 26.18 kg/m2 25.42 kg/m2 25.78 kg/m2      Seasonal allergies Controlled, no change in medication   Prediabetes Patient educated about the importance of limiting  Carbohydrate intake , the need to commit to daily physical activity for a minimum of 30 minutes , and to commit weight loss. The fact that changes in all these areas will reduce or eliminate all together the development of diabetes is stressed.  Updated lab needed at/ before next visit.   Diabetic  Labs Latest Ref Rng & Units 10/06/2015 06/03/2015 11/21/2011 09/28/2011 04/25/2011  HbA1c <5.7 % 5.7(H) - 5.7(H) 5.9(H) -  Chol 0 - 200 mg/dL - 161167 - 096151 -  HDL >04>40 mg/dL - 66 - 62 -  Calc LDL 0 - 99 mg/dL - 94 - 78 -  Triglycerides <150 mg/dL - 33 - 55 -  Creatinine 0.50 - 1.05 mg/dL 5.400.60 9.810.76 1.910.69 4.780.70 2.950.64   BP/Weight 03/02/2016 10/06/2015 09/25/2015 05/29/2015 11/27/2013 10/23/2013 07/01/2013  Systolic BP 122 122 138 122 120 128 102  Diastolic BP 82 64  72 84 84 80 74  Wt. (Lbs) 143.12 139 141 138 146 144 136.04  BMI 26.18 25.42 25.78 25.23 26.7 26.33 24.88   No flowsheet data found.    Intrinsic asthma Controlled, no change in medication   Lower extremity pain Worsening, likely due to back pain

## 2016-03-06 NOTE — Assessment & Plan Note (Signed)
Worsening, likely due to back pain

## 2016-03-06 NOTE — Assessment & Plan Note (Signed)
Increased and uncontrolled , x ray of lumbar spine. Back exercises and weight loss encouraged

## 2016-03-06 NOTE — Assessment & Plan Note (Signed)
Controlled, no change in medication DASH diet and commitment to daily physical activity for a minimum of 30 minutes discussed and encouraged, as a part of hypertension management. The importance of attaining a healthy weight is also discussed.  BP/Weight 03/02/2016 10/06/2015 09/25/2015 05/29/2015 11/27/2013 10/23/2013 07/01/2013  Systolic BP 122 122 138 122 120 128 102  Diastolic BP 82 64 72 84 84 80 74  Wt. (Lbs) 143.12 139 141 138 146 144 136.04  BMI 26.18 25.42 25.78 25.23 26.7 26.33 24.88

## 2016-04-06 ENCOUNTER — Telehealth: Payer: Self-pay

## 2016-04-06 NOTE — Telephone Encounter (Signed)
Need x rays , need to knwo cause of pain, tylenol up to 3 [per day in the interim

## 2016-04-06 NOTE — Telephone Encounter (Signed)
Noted  

## 2016-05-03 ENCOUNTER — Ambulatory Visit (HOSPITAL_COMMUNITY)
Admission: RE | Admit: 2016-05-03 | Discharge: 2016-05-03 | Disposition: A | Discharge status: Home / Self Care | Payer: 59 | Source: Ambulatory Visit | Attending: Family Medicine | Admitting: Family Medicine

## 2016-05-03 DIAGNOSIS — M5136 Other intervertebral disc degeneration, lumbar region: Secondary | ICD-10-CM | POA: Diagnosis not present

## 2016-05-03 DIAGNOSIS — M549 Dorsalgia, unspecified: Secondary | ICD-10-CM | POA: Diagnosis not present

## 2016-05-05 ENCOUNTER — Other Ambulatory Visit (HOSPITAL_COMMUNITY)
Admission: RE | Admit: 2016-05-05 | Discharge: 2016-05-05 | Disposition: A | Discharge status: Home / Self Care | Payer: 59 | Source: Ambulatory Visit | Attending: Family Medicine | Admitting: Family Medicine

## 2016-05-05 ENCOUNTER — Ambulatory Visit (INDEPENDENT_AMBULATORY_CARE_PROVIDER_SITE_OTHER): Payer: 59 | Admitting: Family Medicine

## 2016-05-05 ENCOUNTER — Encounter: Payer: Self-pay | Admitting: Family Medicine

## 2016-05-05 VITALS — BP 140/94 | HR 83 | Resp 16 | Ht 62.0 in | Wt 151.0 lb

## 2016-05-05 DIAGNOSIS — Z1231 Encounter for screening mammogram for malignant neoplasm of breast: Secondary | ICD-10-CM | POA: Diagnosis not present

## 2016-05-05 DIAGNOSIS — Z01419 Encounter for gynecological examination (general) (routine) without abnormal findings: Secondary | ICD-10-CM | POA: Insufficient documentation

## 2016-05-05 DIAGNOSIS — Z1151 Encounter for screening for human papillomavirus (HPV): Secondary | ICD-10-CM | POA: Insufficient documentation

## 2016-05-05 DIAGNOSIS — I10 Essential (primary) hypertension: Secondary | ICD-10-CM

## 2016-05-05 DIAGNOSIS — Z124 Encounter for screening for malignant neoplasm of cervix: Secondary | ICD-10-CM

## 2016-05-05 DIAGNOSIS — R7303 Prediabetes: Secondary | ICD-10-CM

## 2016-05-05 DIAGNOSIS — Z1211 Encounter for screening for malignant neoplasm of colon: Secondary | ICD-10-CM

## 2016-05-05 DIAGNOSIS — Z0000 Encounter for general adult medical examination without abnormal findings: Secondary | ICD-10-CM | POA: Diagnosis not present

## 2016-05-05 LAB — POC HEMOCCULT BLD/STL (OFFICE/1-CARD/DIAGNOSTIC): Fecal Occult Blood, POC: NEGATIVE

## 2016-05-05 NOTE — Patient Instructions (Addendum)
F/u in 5 month, call if you need me before  You need mammogram and colonoscopy, please schedule  Fasting lipid, cmp, HBA1C and TSH end January  congrats , and all the best in your new life1  Thank you  for choosing [facility]. We consider it a privelige to serve you.  Delivering excellent health care in a caring and  compassionate way is our goal.  Partnering with you,  so that together we can achieve this goal is our strategy.

## 2016-05-05 NOTE — Progress Notes (Signed)
    [name] CarinaJanice L [name]     MRN: [id number]      DOB: 09-09-1955  HPI: Patient is in for annual physical exam. No other health concerns are expressed or addressed at the visit. Recent labs, if available are reviewed. Immunization is reviewed , and  updated if needed. Needs mammogram and colonoscopy, states will schedule and get them done   PE: Pleasant  female, alert and oriented x 3, in no cardio-pulmonary distress. Afebrile. HEENT No facial trauma or asymetry. Sinuses non tender.  Extra occullar muscles intact, pupils equally reactive to light. External ears normal, tympanic membranes clear. Oropharynx moist, no exudate. Neck: supple, no adenopathy,JVD or thyromegaly.No bruits.  Chest: Clear to ascultation bilaterally.No crackles or wheezes. Non tender to palpation  Breast: No asymetry,no masses or lumps. No tenderness. No nipple discharge or inversion. No axillary or supraclavicular adenopathy  Cardiovascular system; Heart sounds normal,  S1 and  S2 ,no S3.  No murmur, or thrill. Apical beat not displaced Peripheral pulses normal.  Abdomen: Soft, non tender, no organomegaly or masses. No bruits. Bowel sounds normal. No guarding, tenderness or rebound.  Rectal:  Normal sphincter tone. No rectal mass. Guaiac negative stool.  GU: External genitalia normal female genitalia , normal female distribution of hair. No lesions. Urethral meatus normal in size, no  Prolapse, no lesions visibly  Present. Bladder non tender. Vagina pink and moist , with no visible lesions , discharge present . Adequate pelvic support no  cystocele or rectocele noted Cervix pink and appears healthy, no lesions or ulcerations noted, no discharge noted from os Uterus normal size, no adnexal masses, no cervical motion or adnexal tenderness.   Musculoskeletal exam: Full ROM of spine, hips , shoulders and knees. No deformity ,swelling or crepitus noted. No muscle wasting or atrophy.    Neurologic: Cranial nerves 2 to 12 intact. Power, tone ,sensation and reflexes normal throughout. No disturbance in gait. No tremor.  Skin: Intact, no ulceration, erythema , scaling or rash noted. Pigmentation normal throughout  Psych; Normal mood and affect. Judgement and concentration normal   Assessment & Plan:  Annual physical exam Annual exam as documented. Counseling done  re healthy lifestyle involving commitment to 150 minutes exercise per week, heart healthy diet, and attaining healthy weight.The importance of adequate sleep also discussed.   Immunization and cancer screening needs are specifically addressed at this visit.   Essential hypertension Uncontrolled, has not taken antihypertensive med todat, importance of taking medication daily discussed DASH diet and commitment to daily physical activity for a minimum of 30 minutes discussed and encouraged, as a part of hypertension management. The importance of attaining a healthy weight is also discussed.  BP/Weight 05/05/2016 03/02/2016 10/06/2015 09/25/2015 05/29/2015 11/27/2013 10/23/2013  Systolic BP 140 122 122 138 122 120 128  Diastolic BP 94 82 64 72 84 84 80  Wt. (Lbs) 151 143.12 139 141 138 146 144  BMI 27.62 26.18 25.42 25.78 25.23 26.7 26.33    No med change

## 2016-05-05 NOTE — Assessment & Plan Note (Signed)
Annual exam as documented. Counseling done  re healthy lifestyle involving commitment to 150 minutes exercise per week, heart healthy diet, and attaining healthy weight.The importance of adequate sleep also discussed.  Immunization and cancer screening needs are specifically addressed at this visit.  

## 2016-05-05 NOTE — Addendum Note (Signed)
Addended by: Abner GreenspanHUDY, Emmauel Hallums H on: 05/05/2016 04:26 PM   Modules accepted: Orders

## 2016-05-05 NOTE — Assessment & Plan Note (Signed)
Uncontrolled, has not taken antihypertensive med todat, importance of taking medication daily discussed DASH diet and commitment to daily physical activity for a minimum of 30 minutes discussed and encouraged, as a part of hypertension management. The importance of attaining a healthy weight is also discussed.  BP/Weight 05/05/2016 03/02/2016 10/06/2015 09/25/2015 05/29/2015 11/27/2013 10/23/2013  Systolic BP 140 122 122 138 122 120 128  Diastolic BP 94 82 64 72 84 84 80  Wt. (Lbs) 151 143.12 139 141 138 146 144  BMI 27.62 26.18 25.42 25.78 25.23 26.7 26.33    No med change

## 2016-05-06 ENCOUNTER — Telehealth: Payer: Self-pay | Admitting: Family Medicine

## 2016-05-06 ENCOUNTER — Other Ambulatory Visit: Payer: Self-pay

## 2016-05-06 MED ORDER — BENAZEPRIL-HYDROCHLOROTHIAZIDE 10-12.5 MG PO TABS: 1.0000 | ORAL_TABLET | Freq: Every day | ORAL | 0 refills | Status: DC

## 2016-05-06 NOTE — Telephone Encounter (Signed)
[name] [name] is asking for about 3 Blood Pressure Pills until she can get hers through the pharmacy in [place], Please advise?

## 2016-05-06 NOTE — Telephone Encounter (Signed)
[facility]

## 2016-05-10 LAB — CYTOLOGY - PAP
DIAGNOSIS: NEGATIVE
HPV: NOT DETECTED

## 2016-05-17 ENCOUNTER — Other Ambulatory Visit: Payer: Self-pay | Admitting: Family Medicine

## 2016-06-20 ENCOUNTER — Ambulatory Visit (INDEPENDENT_AMBULATORY_CARE_PROVIDER_SITE_OTHER): Payer: 59 | Admitting: Family Medicine

## 2016-06-20 DIAGNOSIS — B86 Scabies: Secondary | ICD-10-CM

## 2016-06-20 DIAGNOSIS — L309 Dermatitis, unspecified: Secondary | ICD-10-CM | POA: Diagnosis not present

## 2016-06-20 MED ORDER — PERMETHRIN 5 % EX CREA: 1.0000 "application " | TOPICAL_CREAM | Freq: Once | CUTANEOUS | 1 refills | Status: DC

## 2016-06-20 MED ORDER — PERMETHRIN 5 % EX CREA: 1.0000 "application " | TOPICAL_CREAM | Freq: Once | CUTANEOUS | 1 refills | Status: AC

## 2016-06-20 MED ORDER — METHYLPREDNISOLONE 4 MG PO TBPK: ORAL_TABLET | ORAL | 0 refills | Status: DC

## 2016-06-20 NOTE — Patient Instructions (Addendum)
Take benadryl as needed for itching Take the prednisone as directed  Take all of day one today Do the cream application as instructed Repeat in one week if needed Out of work one week   Scabies, Adult Introduction Scabies is a skin condition that happens when very small insects get under the skin (infestation). This causes a rash and severe itchiness. Scabies can spread from person to person (is contagious). If you get scabies, it is common for others in your household to get scabies too. With proper treatment, symptoms usually go away in 2-4 weeks. Scabies usually does not cause lasting problems. What are the causes? This condition is caused by mites (Sarcoptes scabiei, or human itch mites) that can only be seen with a microscope. The mites get into the top layer of skin and lay eggs. Scabies can spread from person to person through:  Close contact with a person who has scabies.  Contact with infested items, such as towels, bedding, or clothing. What increases the risk? This condition is more likely to develop in:  People who live in nursing homes and other extended-care facilities.  People who have sexual contact with a partner who has scabies.  Young children who attend child care facilities.  People who care for others who are at increased risk for scabies. What are the signs or symptoms? Symptoms of this condition may include:  Severe itchiness. This is often worse at night.  A rash that includes tiny red bumps or blisters. The rash commonly occurs on the wrist, elbow, armpit, fingers, waist, groin, or buttocks. Bumps may form a line (burrow) in some areas.  Skin irritation. This can include scaly patches or sores. How is this diagnosed? This condition is diagnosed with a physical exam. Your health care provider will look closely at your skin. In some cases, your health care provider may take a sample of your affected skin (skin scraping) and have it examined under a  microscope. How is this treated? This condition may be treated with:  Medicated cream or lotion that kills the mites. This is spread on the entire body and left on for several hours. Usually, one treatment with medicated cream or lotion is enough to kill all of the mites. In severe cases, the treatment may be repeated.  Medicated cream that relieves itching.  Medicines that help to relieve itching.  Medicines that kill the mites. This treatment is rarely used. Follow these instructions at home:   Medicines  Take or apply over-the-counter and prescription medicines as told by your health care provider.  Apply medicated cream or lotion as told by your health care provider.  Do not wash off the medicated cream or lotion until the necessary amount of time has passed. Skin Care  Avoid scratching your affected skin.  Keep your fingernails closely trimmed to reduce injury from scratching.  Take cool baths or apply cool washcloths to help reduce itching. General instructions  Clean all items that you recently had contact with, including bedding, clothing, and furniture. Do this on the same day that your treatment starts.  Use hot water when you wash items.  Place unwashable items into closed, airtight plastic bags for at least 3 days. The mites cannot live for more than 3 days away from human skin.  Vacuum furniture and mattresses that you use.  Make sure that other people who may have been infested are examined by a health care provider. These include members of your household and anyone who may have  had contact with infested items.  Keep all follow-up visits as told by your health care provider. This is important. Contact a health care provider if:  You have itching that does not go away after 4 weeks of treatment.  You continue to develop new bumps or burrows.  You have redness, swelling, or pain in your rash area after treatment.  You have fluid, blood, or pus coming from  your rash. This information is not intended to replace advice given to you by your health care provider. Make sure you discuss any questions you have with your health care provider. Document Released: 01/28/2015 Document Revised: 10/15/2015 Document Reviewed: 12/09/2014  2017 [employer]

## 2016-06-20 NOTE — Progress Notes (Signed)
Chief Complaint  Patient presents with  . 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 Intrinsic asthma 03/06/2016  . Lower extremity pain 03/06/2016  . Back pain with radiation 03/02/2016  . Postmenopausal atrophic vaginitis 10/23/2013  . Annual physical exam 04/08/2012  . Prediabetes 11/22/2011  . Hyperpigmentation of skin 09/28/2011  . Seasonal allergies 03/15/2011  . Essential hypertension 12/01/2009  . Overweight 09/16/2008  . CONTACT DERMATITIS&OTHER ECZEMA DUE TO PLANTS 09/16/2008    Outpatient Encounter Prescriptions as of 06/20/2016  Medication Sig  . benazepril-hydrochlorthiazide (LOTENSIN HCT) 10-12.5 MG tablet Take 1 tablet by mouth daily.  . Vitamin D, Ergocalciferol, (DRISDOL) 50000 units CAPS capsule TAKE 1 CAPSULE BY MOUTH ONCE A WEEK  . methylPREDNISolone (MEDROL DOSEPAK) 4 MG TBPK tablet tad  . permethrin (ELIMITE) 5 % cream Apply 1 application topically once. Follow directions   No facility-administered encounter medications on file as of 06/20/2016.     No Known Allergies  Review of Systems  Constitutional: Negative for activity change, appetite change and fever.  HENT: Negative for congestion and facial swelling.   Eyes: Negative for itching and visual disturbance.  Respiratory: Negative for cough, chest tightness and shortness of breath.   Gastrointestinal: Negative for diarrhea and vomiting.  Skin: Positive for rash.  Allergic/Immunologic: Negative for environmental allergies and food allergies.  All other systems reviewed and are negative.   There were no vitals taken for this visit.  Physical Exam  Constitutional: She is oriented to person,  place, and time. She appears well-developed and well-nourished. No distress.  Well dressed and groomed  HENT:  Head: Normocephalic and atraumatic.  Mouth/Throat: Oropharynx is clear and moist.  Eyes: Conjunctivae are normal. Pupils are equal, round, and reactive to light.  Neck: Normal range of motion. Neck supple.  Cardiovascular: Normal rate, regular rhythm and normal heart sounds.   Pulmonary/Chest: Effort normal and breath sounds normal.  Abdominal: Soft. Bowel sounds are normal.  Lymphadenopathy:    She has no cervical adenopathy.  Neurological: She is alert and oriented to person, place, and time.  Skin: Skin is warm and dry. Rash noted. Rash is papular and maculopapular.     There are two patches of confluent papules across low back.  No vesicles or excoriation. Each measures 1-2 cm across  Psychiatric: She has a normal mood and affect. Her behavior is normal.    ASSESSMENT/PLAN:  1. Scabies Likely given the intense itching and spread to husband,  ?possible bedbugs or other insect.  Doubt infection.    2. Dermatitis Patient evaluated by Dr Lodema Hong in addition to myself.   Patient Instructions  Take benadryl as needed for itching Take the prednisone as directed  Take all of day one today Do the cream application as instructed Repeat in one week if needed Out of work one week   Scabies, Adult Introduction Scabies is a skin condition that happens when very small insects get under the skin (infestation). This causes a rash and severe itchiness. Scabies can spread from person to person (is contagious). If you get scabies, it is common for others in your household to get  scabies too. With proper treatment, symptoms usually go away in 2-4 weeks. Scabies usually does not cause lasting problems. What are the causes? This condition is caused by mites (Sarcoptes scabiei, or human itch mites) that can only be seen with a microscope. The mites get into the top layer of skin and  lay eggs. Scabies can spread from person to person through:  Close contact with a person who has scabies.  Contact with infested items, such as towels, bedding, or clothing. What increases the risk? This condition is more likely to develop in:  People who live in nursing homes and other extended-care facilities.  People who have sexual contact with a partner who has scabies.  Young children who attend child care facilities.  People who care for others who are at increased risk for scabies. What are the signs or symptoms? Symptoms of this condition may include:  Severe itchiness. This is often worse at night.  A rash that includes tiny red bumps or blisters. The rash commonly occurs on the wrist, elbow, armpit, fingers, waist, groin, or buttocks. Bumps may form a line (burrow) in some areas.  Skin irritation. This can include scaly patches or sores. How is this diagnosed? This condition is diagnosed with a physical exam. Your health care provider will look closely at your skin. In some cases, your health care provider may take a sample of your affected skin (skin scraping) and have it examined under a microscope. How is this treated? This condition may be treated with:  Medicated cream or lotion that kills the mites. This is spread on the entire body and left on for several hours. Usually, one treatment with medicated cream or lotion is enough to kill all of the mites. In severe cases, the treatment may be repeated.  Medicated cream that relieves itching.  Medicines that help to relieve itching.  Medicines that kill the mites. This treatment is rarely used. Follow these instructions at home:   Medicines  Take or apply over-the-counter and prescription medicines as told by your health care provider.  Apply medicated cream or lotion as told by your health care provider.  Do not wash off the medicated cream or lotion until the necessary amount of time has passed. Skin  Care  Avoid scratching your affected skin.  Keep your fingernails closely trimmed to reduce injury from scratching.  Take cool baths or apply cool washcloths to help reduce itching. General instructions  Clean all items that you recently had contact with, including bedding, clothing, and furniture. Do this on the same day that your treatment starts.  Use hot water when you wash items.  Place unwashable items into closed, airtight plastic bags for at least 3 days. The mites cannot live for more than 3 days away from human skin.  Vacuum furniture and mattresses that you use.  Make sure that other people who may have been infested are examined by a health care provider. These include members of your household and anyone who may have had contact with infested items.  Keep all follow-up visits as told by your health care provider. This is important. Contact a health care provider if:  You have itching that does not go away after 4 weeks of treatment.  You continue to develop new bumps or burrows.  You have redness, swelling, or pain in your rash area after treatment.  You have fluid, blood, or pus coming from your rash. This information is not intended to replace advice given to you by  your health care provider. Make sure you discuss any questions you have with your health care provider. Document Released: 01/28/2015 Document Revised: 10/15/2015 Document Reviewed: 12/09/2014  2017 Elsevier    Eustace MooreYvonne Sue Nelson, MD

## 2016-07-03 ENCOUNTER — Encounter (HOSPITAL_COMMUNITY): Payer: Self-pay | Admitting: *Deleted

## 2016-07-03 ENCOUNTER — Emergency Department (HOSPITAL_COMMUNITY)
Admission: EM | Admit: 2016-07-03 | Discharge: 2016-07-03 | Disposition: A | Discharge status: Home / Self Care | Payer: 59 | Attending: Emergency Medicine | Admitting: Emergency Medicine

## 2016-07-03 DIAGNOSIS — J45909 Unspecified asthma, uncomplicated: Secondary | ICD-10-CM | POA: Insufficient documentation

## 2016-07-03 DIAGNOSIS — I10 Essential (primary) hypertension: Secondary | ICD-10-CM | POA: Insufficient documentation

## 2016-07-03 DIAGNOSIS — Z5321 Procedure and treatment not carried out due to patient leaving prior to being seen by health care provider: Secondary | ICD-10-CM | POA: Diagnosis not present

## 2016-07-03 DIAGNOSIS — Z79899 Other long term (current) drug therapy: Secondary | ICD-10-CM | POA: Insufficient documentation

## 2016-07-03 DIAGNOSIS — L509 Urticaria, unspecified: Secondary | ICD-10-CM | POA: Diagnosis not present

## 2016-07-03 DIAGNOSIS — R21 Rash and other nonspecific skin eruption: Secondary | ICD-10-CM | POA: Diagnosis not present

## 2016-07-03 NOTE — ED Notes (Signed)
Pt came to the station and stated that she was leaving. Pt did not want to stay.

## 2016-07-03 NOTE — ED Notes (Signed)
Pt came to desk again to inquire about the wait.  Pt unhappy when explained pts are not seen based on wait times or first come first serve basis.  Pt requests to speak with supervisor, Rudie Meyerim Goins notified to speak with pt

## 2016-07-03 NOTE — ED Triage Notes (Signed)
Pt states she was treated for scabies about 3 weeks and was given a steroid and cream. Pt states her rash has came back.

## 2016-07-05 ENCOUNTER — Ambulatory Visit (INDEPENDENT_AMBULATORY_CARE_PROVIDER_SITE_OTHER): Payer: 59 | Admitting: Allergy & Immunology

## 2016-07-05 ENCOUNTER — Encounter: Payer: Self-pay | Admitting: Allergy & Immunology

## 2016-07-05 VITALS — BP 118/72 | HR 80 | Temp 98.2°F | Resp 20 | Ht 60.5 in | Wt 148.0 lb

## 2016-07-05 DIAGNOSIS — L508 Other urticaria: Secondary | ICD-10-CM | POA: Diagnosis not present

## 2016-07-05 DIAGNOSIS — J4520 Mild intermittent asthma, uncomplicated: Secondary | ICD-10-CM | POA: Diagnosis not present

## 2016-07-05 DIAGNOSIS — J3089 Other allergic rhinitis: Secondary | ICD-10-CM | POA: Diagnosis not present

## 2016-07-05 DIAGNOSIS — T781XXD Other adverse food reactions, not elsewhere classified, subsequent encounter: Secondary | ICD-10-CM

## 2016-07-05 LAB — CBC WITH DIFFERENTIAL/PLATELET
BASOS ABS: 0 {cells}/uL (ref 0–200)
Basophils Relative: 0 %
EOS PCT: 0 %
Eosinophils Absolute: 0 cells/uL — ABNORMAL LOW (ref 15–500)
HEMATOCRIT: 37.9 % (ref 35.0–45.0)
HEMOGLOBIN: 12.3 g/dL (ref 11.7–15.5)
LYMPHS ABS: 2835 {cells}/uL (ref 850–3900)
Lymphocytes Relative: 35 %
MCH: 26.2 pg — ABNORMAL LOW (ref 27.0–33.0)
MCHC: 32.5 g/dL (ref 32.0–36.0)
MCV: 80.6 fL (ref 80.0–100.0)
MONO ABS: 405 {cells}/uL (ref 200–950)
MPV: 8.7 fL (ref 7.5–12.5)
Monocytes Relative: 5 %
NEUTROS ABS: 4860 {cells}/uL (ref 1500–7800)
Neutrophils Relative %: 60 %
Platelets: 483 10*3/uL — ABNORMAL HIGH (ref 140–400)
RBC: 4.7 MIL/uL (ref 3.80–5.10)
RDW: 14.8 % (ref 11.0–15.0)
WBC: 8.1 10*3/uL (ref 3.8–10.8)

## 2016-07-05 LAB — SEDIMENTATION RATE: SED RATE: 28 mm/h (ref 0–30)

## 2016-07-05 MED ORDER — FLUTICASONE PROPIONATE 50 MCG/ACT NA SUSP: NASAL | 5 refills | Status: DC

## 2016-07-05 MED ORDER — ALBUTEROL SULFATE HFA 108 (90 BASE) MCG/ACT IN AERS: 2.0000 | INHALATION_SPRAY | RESPIRATORY_TRACT | 1 refills | Status: AC | PRN

## 2016-07-05 NOTE — Patient Instructions (Addendum)
1. Chronic urticaria - With chronic hives, we only find a cause in 15-20% of the patients.  - Your history does not have any "red flags" such as fevers, joint pains, or permanent skin changes that would be concerning for a more serious cause of hives.  - We will get some labs to rule out serious causes of hives: complete blood count, tryptase level, chronic urticaria panel, ESR, and CRP. - Chronic hives are often times a self limited process and will "burn themselves out" over 6-12 months, although this is not always the case.  - In the meantime, start suppressive dosing of antihistamines: Allegra (fexofenadine) 333m (two tablets) in the morning and Zyrtec (cetirizine) 220m(two tablets) in the evening. - You can change this dosing at home, decreasing the dose as needed or increasing the dosing as needed.  - If you are not tolerating the medications or are tired of taking them every day, we can start treatment with a monthly injectable medication called Xolair.   2. Perennial allergic rhinitis - Testing today showed: grasses, weeds, ragweed, trees, molds, dust mite, cats - Avoidance measures discussed. - The antihistamines should help with the symptoms. - Start Flonase 1-2 sprays per nostril daily. - We can consider allergy shots in the future if symptoms are not well controlled.   3. Mild intermittent asthma, uncomplicated - Lung function looked perfect today. - We will send in a prescription for ProAir four puffs every 4-6 hours as needed.   4. Return in about 3 months (around 10/02/2016).  Please inform usKoreaf any Emergency Department visits, hospitalizations, or changes in symptoms. Call usKoreaefore going to the ED for breathing or allergy symptoms since we might be able to fit you in for a sick visit. Feel free to contact usKoreanytime with any questions, problems, or concerns.  It was a pleasure to meet you today! Best wishes [place] the [place]!   Websites that have reliable patient  information: 1. American Academy of Asthma, Allergy, and Immunology: www.aaaai.org 2. Food Allergy Research and Education (FARE): foodallergy.org 3. Mothers of Asthmatics: http://www.asthmacommunitynetwork.org 4. American College of Allergy, Asthma, and Immunology: www.acaai.org  Control of House Dust Mite Allergen    House dust mites play a major role in allergic asthma and rhinitis.  They occur in environments with high humidity wherever human skin, the food for dust mites is found. High levels have been detected in dust obtained from mattresses, pillows, carpets, upholstered furniture, bed covers, clothes and soft toys.  The principal allergen of the house dust mite is found in its feces.  A gram of dust may contain 1,000 mites and 250,000 fecal particles.  Mite antigen is easily measured in the air during house cleaning activities.    1. Encase mattresses, including the box spring, and pillow, in an air tight cover.  Seal the zipper end of the encased mattresses with wide adhesive tape. 2. Wash the bedding in water of 130 degrees Farenheit weekly.  Avoid cotton comforters/quilts and flannel bedding: the most ideal bed covering is the dacron comforter. 3. Remove all upholstered furniture from the bedroom. 4. Remove carpets, carpet padding, rugs, and non-washable window drapes from the bedroom.  Wash drapes weekly or use plastic window coverings. 5. Remove all non-washable stuffed toys from the bedroom.  Wash stuffed toys weekly. 6. Have the room cleaned frequently with a vacuum cleaner and a damp dust-mop.  The patient should not be in a room which is being cleaned and should wait  1 hour after cleaning before going into the room. 7. Close and seal all heating outlets in the bedroom.  Otherwise, the room will become filled with dust-laden air.  An electric heater can be used to heat the room. 8. Reduce indoor humidity to less than 50%.  Do not use a humidifier.  Control of Dog or Cat  Allergen  Avoidance is the best way to manage a dog or cat allergy. If you have a dog or cat and are allergic to dog or cats, consider removing the dog or cat from the home. If you have a dog or cat but don't want to find it a new home, or if your family wants a pet even though someone in the household is allergic, here are some strategies that may help keep symptoms at bay:  1. Keep the pet out of your bedroom and restrict it to only a few rooms. Be advised that keeping the dog or cat in only one room will not limit the allergens to that room. 2. Don't pet, hug or kiss the dog or cat; if you do, wash your hands with soap and water. 3. High-efficiency particulate air (HEPA) cleaners run continuously in a bedroom or living room can reduce allergen levels over time. 4. Regular use of a high-efficiency vacuum cleaner or a central vacuum can reduce allergen levels. 5. Giving your dog or cat a bath at least once a week can reduce airborne allergen.  Reducing Pollen Exposure  The American Academy of Allergy, Asthma and Immunology suggests the following steps to reduce your exposure to pollen during allergy seasons.    1. Do not hang sheets or clothing out to dry; pollen may collect on these items. 2. Do not mow lawns or spend time around freshly cut grass; mowing stirs up pollen. 3. Keep windows closed at night.  Keep car windows closed while driving. 4. Minimize morning activities outdoors, a time when pollen counts are usually at their highest. 5. Stay indoors as much as possible when pollen counts or humidity is high and on windy days when pollen tends to remain in the air longer. 6. Use air conditioning when possible.  Many air conditioners have filters that trap the pollen spores. 7. Use a HEPA room air filter to remove pollen form the indoor air you breathe.  Control of Mold Allergen  Mold and fungi can grow on a variety of surfaces provided certain temperature and moisture conditions exist.   Outdoor molds grow on plants, decaying vegetation and soil.  The major outdoor mold, Alternaria and Cladosporium, are found in very high numbers during hot and dry conditions.  Generally, a late Summer - Fall peak is seen for common outdoor fungal spores.  Rain will temporarily lower outdoor mold spore count, but counts rise rapidly when the rainy period ends.  The most important indoor molds are Aspergillus and Penicillium.  Dark, humid and poorly ventilated basements are ideal sites for mold growth.  The next most common sites of mold growth are the bathroom and the kitchen.  Outdoor [employer] 1. Use air conditioning and keep windows closed 2. Avoid exposure to decaying vegetation. 3. Avoid leaf raking. 4. Avoid grain handling. 5. Consider wearing a face mask if working in moldy areas.  Indoor Mold Control 1. Maintain humidity below 50%. 2. Clean washable surfaces with 5% bleach solution. 3. Remove sources e.g. contaminated carpets.

## 2016-07-05 NOTE — Progress Notes (Signed)
NEW PATIENT  Date of Service/Encounter:  07/05/16  Referring provider: Tula Nakayama, MD   Assessment:   Chronic urticaria  Perennial allergic rhinitis (grasses, weeds, ragweed, trees, molds, dust mite, cats)  Mild intermittent asthma, uncomplicated   Asthma Reportables:  Severity: intermittent  Risk: low Control: well controlled  Seasonal Influenza Vaccine: yes    Plan/Recommendations:   1. Chronic urticaria - With chronic hives, we only find a cause in 15-20% of the patients.  - Testing was negative to all of the most common foods but there were some environmental allergens that did come back positive (notably dust mites and cat, which are associated with hives). - Her history does not have any "red flags" such as fevers, joint pains, or permanent skin changes that would be concerning for a more serious cause of hives.  - We will get some labs to rule out serious causes of hives: complete blood count, tryptase level, chronic urticaria panel, ESR, and CRP. - Chronic hives are often times a self limited process and will "burn themselves out" over 6-12 months, although this is not always the case.  - In the meantime, I recommended suppressive dosing of antihistamines: Allegra (fexofenadine) 387m (two tablets) in the morning and Zyrtec (cetirizine) 242m(two tablets) in the evening. - Ms. [name] change this dosing at home, decreasing the dose as needed or increasing the dosing as needed.  - If Ms. [name] not tolerating the medications or are tired of taking them every day, we can start treatment with a monthly injectable medication called Xolair.   2. Perennial allergic rhinitis - Testing today showed: grasses, weeds, ragweed, trees, molds, dust mite, cats - Avoidance measures discussed. - The antihistamines should help with the symptoms. - Start Flonase 1-2 sprays per nostril daily. - We can consider allergy shots in the future if symptoms are not well controlled.    3. Mild intermittent asthma, uncomplicated - Lung function looked perfect today. - We will send in a prescription for ProAir four puffs every 4-6 hours as needed.  - There is no indication for a controller medication at this time.   4. Return in about 3 months (around 10/02/2016).   Subjective:   [name] MCCRORYs a [age].o. female presenting today for evaluation of  Chief Complaint  Patient presents with  . Urticaria    onset 05/2016 went to ED Sunday     [name] YOUNTSas a history of the following: Patient Active Problem List   Diagnosis Date Noted  . Intrinsic asthma 03/06/2016  . Lower extremity pain 03/06/2016  . Back pain with radiation 03/02/2016  . Postmenopausal atrophic vaginitis 10/23/2013  . Annual physical exam 04/08/2012  . Prediabetes 11/22/2011  . Hyperpigmentation of skin 09/28/2011  . Seasonal allergies 03/15/2011  . Essential hypertension 12/01/2009  . Overweight 09/16/2008  . CONTACT DERMATITIS&OTHER ECZEMA DUE TO PLANTS 09/16/2008    History obtained from: chart review and patient.  [name] [name] referred by [name] NakayamaMD.     [name] [name] a [age].o. female presenting for evaluation of hives. She has had hives for one month. She does not remember what she was doing at the time. She is not eating anything new. She thinks that this may have occurred around the time that she was changing her detergents. She knows that she cannot do Tide. She changed from Cheer to a different detergent. She was eating a lot of peanuts last month as well. She has  has not had peanuts since that time. She does eat tree nuts without a problem. She does eat bread and pasta as well as oatmeal. She does eat eggs, milk, cheese, soy, and fish. She avoids all shellfish due to a history of hives with that. This first occurred five years ago. She does not have an EpiPen. The original reaction was with lobster but she developed throat and tongue swelling in the evening. She went to  the ED and was treated there last weekend.   Her hives are extremely pruritic and "hot". She denies fevers and joint pains. She started using calamine lotion with improvement in her symptoms. She did get some prescription cream that did not seem to help. After they came back after three weeks, she went to the ED (07/02/16) where she was given a shot of steroids as well as a steroid pack. She was given azithromycin as well as Bactrim. She remains on the Bactrim and started them both yesterday. She did have a cough but otherwise no signs of a bacterial infection. Her last course of antihistamines was one week ago.   She does have a history of seasonal allergies that occur worst in the summer. She takes "alklergy pills" for that. She has never been on a nose spray. She has been allergy tested 20 years ago at a practice in [place]. Cats do make her symptoms worse. She does have a history of asthma and wheezes when she gets a cold. She does not have an inhaler. She does cough at night around 0-1 times per week. She denies problems with ADLs and endurance with regards to physical activity. She has never needed prednisone for her breathing and has never needed to go to the ED.   Otherwise, there is no history of other atopic diseases, including asthma, drug allergies, food allergies, environmental allergies, stinging insect allergies, or urticaria. There is no significant infectious history. Vaccinations are up to date.    Past Medical History: Patient Active Problem List   Diagnosis Date Noted  . Intrinsic asthma 03/06/2016  . Lower extremity pain 03/06/2016  . Back pain with radiation 03/02/2016  . Postmenopausal atrophic vaginitis 10/23/2013  . Annual physical exam 04/08/2012  . Prediabetes 11/22/2011  . Hyperpigmentation of skin 09/28/2011  . Seasonal allergies 03/15/2011  . Essential hypertension 12/01/2009  . Overweight 09/16/2008  . CONTACT DERMATITIS&OTHER ECZEMA DUE TO PLANTS 09/16/2008     Medication List:  Allergies as of 07/05/2016   No Known Allergies     Medication List       Accurate as of 07/05/16 12:08 PM. Always use your most recent med list.          benazepril-hydrochlorthiazide 10-12.5 MG tablet Commonly known as:  LOTENSIN HCT Take 1 tablet by mouth daily.   cetirizine 10 MG tablet Commonly known as:  ZYRTEC Take 10 mg by mouth daily.   methylPREDNISolone 4 MG Tbpk tablet Commonly known as:  MEDROL DOSEPAK tad   sulfamethoxazole-trimethoprim 800-160 MG tablet Commonly known as:  BACTRIM DS,SEPTRA DS Take 1 tablet by mouth 2 (two) times daily.   Vitamin D (Ergocalciferol) 50000 units Caps capsule Commonly known as:  DRISDOL TAKE 1 CAPSULE BY MOUTH ONCE A WEEK       Birth History: non-contributory. Born at term without complications.   Developmental History: [name] [name] has met all milestones on time. She has required no speech therapy, occupational therapy, or physical therapy.   Past Surgical History: None   Family History: Family History  Problem Relation Age of Onset  . Arthritis Mother   . Allergic rhinitis Neg Hx   . Angioedema Neg Hx   . Asthma Neg Hx   . Eczema Neg Hx      Social History: Noah lives at home with her husband. She has been married since December 16th, 2017. They have one dog and two cats who are all outside. She works as a Quarry manager at [employer]. They live in a house. There is with flooring throughout. They have gas heating and central cooling. They do have dust mite covers on her bedding, and there is no tobacco smoke exposure.  Review of Systems: a 14-point review of systems is pertinent for what is mentioned in HPI.  Otherwise, all other systems were negative. Constitutional: negative other than that listed in the HPI Eyes: negative other than that listed in the HPI Ears, nose, mouth, throat, and face: negative other than that listed in the HPI Respiratory: negative other than that listed in the  HPI Cardiovascular: negative other than that listed in the HPI Gastrointestinal: negative other than that listed in the HPI Genitourinary: negative other than that listed in the HPI Integument: negative other than that listed in the HPI Hematologic: negative other than that listed in the HPI Musculoskeletal: negative other than that listed in the HPI Neurological: negative other than that listed in the HPI Allergy/Immunologic: negative other than that listed in the HPI    Objective:   Blood pressure 118/72, pulse 80, temperature 98.2 F (36.8 C), temperature source Oral, resp. rate 20, height 5' 0.5" (1.537 m), weight 148 lb (67.1 kg). Body mass index is 28.43 kg/m.   Physical Exam:  General: Alert, interactive, in no acute distress. Cooperative with the exam. Very pleasant and smiling. Eyes: No conjunctival injection present on the right, No conjunctival injection present on the left, PERRL bilaterally, No discharge on the right, No discharge on the left and No Horner-Trantas dots present Ears: Right TM pearly gray with normal light reflex, Left TM pearly gray with normal light reflex, Right TM intact without perforation and Left TM intact without perforation.  Nose/Throat: External nose within normal limits, nasal crease present and septum midline, turbinates edematous and pale with clear discharge, post-pharynx erythematous with cobblestoning in the posterior oropharynx. Tonsils 2+ without exudates Neck: Supple without thyromegaly. Adenopathy: no enlarged lymph nodes appreciated in the anterior cervical, occipital, axillary, epitrochlear, inguinal, or popliteal regions Lungs: Clear to auscultation without wheezing, rhonchi or rales. No increased work of breathing. CV: Normal S1/S2, no murmurs. Capillary refill <2 seconds.  Abdomen: Nondistended, nontender. No guarding or rebound tenderness. Bowel sounds present in all fields and hypoactive  Skin: Scattered erythematous urticarial  type lesions primarily located bilateral arms with some hyperpigmentation present , nonvesicular. Extremities:  No clubbing, cyanosis or edema. Neuro:   Grossly intact. No focal deficits appreciated. Responsive to questions.  Diagnostic studies:  Spirometry: results normal (FEV1: 1.37/78%, FVC: 1.59/74%, FEV1/FVC: 87%).    Spirometry consistent with normal pattern.  Allergy Studies:   Indoor/Outdoor Percutaneous Adult Environmental Panel: positive to bahia grass, [place] grass, johnson grass, [place] blue grass, meadow fescue grass, perennial rye grass, sweet vernal grass, timothy grass, cocklebur, burweed marsh elder, short ragweed, giant ragweed, English plantain, lamb's quarters, rough pigweed, rough marsh elder, common mugwort, ash, hickory, maple, oak, pecan pollen, Alternaria, Penicillium, Bipolaris, Drechslera, Fusarium, epicoccum, Phoma, Tricophyton, Df mite, Dp mites and cat. Otherwise negative with adequate controls.  Most Common Foods Panel (peanut, cashew, soy,  fish mix, shellfish mix, wheat, milk, egg): negative with adequate controls      Salvatore Marvel, MD [facility]

## 2016-07-06 LAB — C-REACTIVE PROTEIN

## 2016-07-08 LAB — TRYPTASE: Tryptase: 6.3 ug/L (ref <11)

## 2016-07-11 LAB — CP CHRONIC URTICARIA INDEX PANEL
TSH: 3.44 mIU/L
Thyroperoxidase Ab SerPl-aCnc: 21 IU/mL — ABNORMAL HIGH (ref <9)

## 2016-07-12 ENCOUNTER — Encounter: Payer: Self-pay | Admitting: Allergy & Immunology

## 2016-07-14 ENCOUNTER — Other Ambulatory Visit: Payer: Self-pay | Admitting: Family Medicine

## 2016-10-04 ENCOUNTER — Ambulatory Visit: Payer: 59 | Admitting: Allergy & Immunology

## 2016-10-06 ENCOUNTER — Encounter: Payer: Self-pay | Admitting: Family Medicine

## 2016-10-06 ENCOUNTER — Ambulatory Visit: Payer: 59 | Admitting: Family Medicine

## 2016-10-26 ENCOUNTER — Telehealth: Payer: Self-pay | Admitting: Family Medicine

## 2016-10-26 NOTE — Telephone Encounter (Signed)
Patient left message on nurse line @8 :19 this morning stating her allergies are bothering her and is requesting something to be called in.    cb  [phone]

## 2016-10-27 ENCOUNTER — Other Ambulatory Visit: Payer: Self-pay | Admitting: Family Medicine

## 2016-10-27 MED ORDER — PREDNISONE 5 MG PO TABS: 5.0000 mg | ORAL_TABLET | Freq: Two times a day (BID) | ORAL | 0 refills | Status: AC

## 2016-10-27 MED ORDER — BENZONATATE 100 MG PO CAPS: 100.0000 mg | ORAL_CAPSULE | Freq: Two times a day (BID) | ORAL | 0 refills | Status: DC | PRN

## 2016-10-27 MED ORDER — PROMETHAZINE-DM 6.25-15 MG/5ML PO SYRP: ORAL_SOLUTION | ORAL | 0 refills | Status: DC

## 2016-10-27 NOTE — Telephone Encounter (Signed)
Pt states she called yesterday am and was just now getting a call back. Was wanting something called in for this "allergy problem" she gets every year. Was in bed all day yesterday feeling bad. No fever. Coughing, some yellow mucus, scratchy throat sinus congestion. Wants something sent to her pharmacy in [place].

## 2016-10-27 NOTE — Telephone Encounter (Signed)
I spoke directly with th pt and have prescribed, 5 days of prednisone, phenergan dM and tesalon perles she is aware

## 2016-10-31 ENCOUNTER — Telehealth: Payer: Self-pay

## 2016-10-31 ENCOUNTER — Ambulatory Visit (INDEPENDENT_AMBULATORY_CARE_PROVIDER_SITE_OTHER): Payer: 59 | Admitting: Family Medicine

## 2016-10-31 ENCOUNTER — Encounter: Payer: Self-pay | Admitting: Family Medicine

## 2016-10-31 VITALS — BP 124/80 | HR 74 | Temp 97.1°F | Resp 18 | Ht 60.5 in | Wt 146.1 lb

## 2016-10-31 DIAGNOSIS — E663 Overweight: Secondary | ICD-10-CM | POA: Diagnosis not present

## 2016-10-31 DIAGNOSIS — J209 Acute bronchitis, unspecified: Secondary | ICD-10-CM

## 2016-10-31 DIAGNOSIS — J45909 Unspecified asthma, uncomplicated: Secondary | ICD-10-CM | POA: Diagnosis not present

## 2016-10-31 DIAGNOSIS — I10 Essential (primary) hypertension: Secondary | ICD-10-CM | POA: Diagnosis not present

## 2016-10-31 MED ORDER — AZITHROMYCIN 250 MG PO TABS: ORAL_TABLET | ORAL | 0 refills | Status: DC

## 2016-10-31 MED ORDER — PROMETHAZINE-DM 6.25-15 MG/5ML PO SYRP: ORAL_SOLUTION | ORAL | 0 refills | Status: DC

## 2016-10-31 NOTE — Assessment & Plan Note (Addendum)
Antibiotic and cough suppressant  prescribed, work excuse x 2 days

## 2016-10-31 NOTE — Progress Notes (Signed)
   [name] [name]     MRN: [id number]      DOB: 1955/06/03   HPI Ms. [name] [name]  Is here stating that since last week Monday, 1 week ago she started experiencing tingling in throat and dry hacking cough, had honey andl emon juice, . No more throat pain after 3 days. But still coughing, going into week 2, has to take cough syrup and water , and chest hurts from excess cough and ribs hurt. Eyes are puffy. Has a h/o childhood asthma No fever.Clear nasal draianage, no sinus pressure , yellow sputum last Monday to Thursday, now clear No good night's sleep due to excess cough Did not work all of last week  ROS  Denies chest pains, palpitations and leg swelling Denies abdominal pain, nausea, vomiting,diarrhea or constipation.   Denies dysuria, frequency, hesitancy or incontinence. Denies joint pain, swelling and limitation in mobility. Denies headaches, seizures, numbness, or tingling. Denies depression, anxiety or insomnia. Denies skin break down or rash.   PE  BP 124/80 (BP Location: Left Arm, Patient Position: Sitting, Cuff Size: Normal)   Pulse 74   Temp 97.1 F (36.2 C) (Temporal)   Resp 18   Ht 5' 0.5" (1.537 m)   Wt 146 lb 1.9 oz (66.3 kg)   SpO2 98%   BMI 28.07 kg/m   Patient alert and oriented and in no cardiopulmonary distress.  HEENT: No facial asymmetry, EOMI,   oropharynx pink and moist.  Neck supple no JVD, no mass.  Chest: adequate air entry , few scattered crackles , no wheezes CVS: S1, S2 no murmurs, no S3.Regular rate.  ABD: Soft non tender.   Ext: No edema  MS: Adequate ROM spine, shoulders, hips and knees.  Skin: Intact, no ulcerations or rash noted.  Psych: Good eye contact, normal affect. Memory intact not anxious or depressed appearing.  CNS: CN 2-12 intact, power,  normal throughout.no focal deficits noted.   Assessment & Plan  Acute bronchitis with asthma Antibiotic and cough suppressant  prescribed, work excuse x 2 days  Intrinsic  asthma Acute flare , depo medrol 80 mg IM in office , has ahd some response to steroids recently prescribed, advised to use albuterol MDI also  Essential hypertension Controlled, no change in medication DASH diet and commitment to daily physical activity for a minimum of 30 minutes discussed and encouraged, as a part of hypertension management. The importance of attaining a healthy weight is also discussed.  BP/Weight 10/31/2016 07/05/2016 07/03/2016 05/05/2016 03/02/2016 10/06/2015 09/25/2015  Systolic BP 124 118 141 140 122 122 138  Diastolic BP 80 72 62 94 82 64 72  Wt. (Lbs) 146.12 148 146 151 143.12 139 141  BMI 28.07 28.43 24.3 27.62 26.18 25.42 25.78       Overweight improved Patient re-educated about  the importance of commitment to a  minimum of 150 minutes of exercise per week.  The importance of healthy food choices with portion control discussed. Encouraged to start a food diary, count calories and to consider  joining a support group. Sample diet sheets offered. Goals set by the patient for the next several months.   Weight /BMI 10/31/2016 07/05/2016 07/03/2016  WEIGHT 146 lb 1.9 oz 148 lb 146 lb  HEIGHT 5' .5" 5' .5" 5\' 5"   BMI 28.07 kg/m2 28.43 kg/m2 24.3 kg/m2

## 2016-10-31 NOTE — Telephone Encounter (Signed)
[name] NixonJanice walked in this am wanting to be seen. States the meds sent for her are not helping and she needs something else done. Please advise if ok to work in?

## 2016-10-31 NOTE — Assessment & Plan Note (Signed)
improved Patient re-educated about  the importance of commitment to a  minimum of 150 minutes of exercise per week.  The importance of healthy food choices with portion control discussed. Encouraged to start a food diary, count calories and to consider  joining a support group. Sample diet sheets offered. Goals set by the patient for the next several months.   Weight /BMI 10/31/2016 07/05/2016 07/03/2016  WEIGHT 146 lb 1.9 oz 148 lb 146 lb  HEIGHT 5' .5" 5' .5" 5\' 5"   BMI 28.07 kg/m2 28.43 kg/m2 24.3 kg/m2

## 2016-10-31 NOTE — Telephone Encounter (Signed)
Yes pls

## 2016-10-31 NOTE — Telephone Encounter (Signed)
Patient seen.

## 2016-10-31 NOTE — Patient Instructions (Addendum)
F/u in June as before , call if you need me soner  You are treated for acute bronchitis and asthma flare, ' Depomedrol injection in office today  Antibiotic and cough syrup sent to [facility]  Work excuse for today and tomorrow return on Wednesday  Call [phone] to schedule mammogram

## 2016-10-31 NOTE — Assessment & Plan Note (Signed)
Controlled, no change in medication DASH diet and commitment to daily physical activity for a minimum of 30 minutes discussed and encouraged, as a part of hypertension management. The importance of attaining a healthy weight is also discussed.  BP/Weight 10/31/2016 07/05/2016 07/03/2016 05/05/2016 03/02/2016 10/06/2015 09/25/2015  Systolic BP 124 118 141 140 122 122 138  Diastolic BP 80 72 62 94 82 64 72  Wt. (Lbs) 146.12 148 146 151 143.12 139 141  BMI 28.07 28.43 24.3 27.62 26.18 25.42 25.78

## 2016-10-31 NOTE — Assessment & Plan Note (Signed)
Acute flare , depo medrol 80 mg IM in office , has ahd some response to steroids recently prescribed, advised to use albuterol MDI also

## 2016-11-01 DIAGNOSIS — J45909 Unspecified asthma, uncomplicated: Secondary | ICD-10-CM | POA: Diagnosis not present

## 2016-11-01 DIAGNOSIS — J209 Acute bronchitis, unspecified: Secondary | ICD-10-CM | POA: Diagnosis not present

## 2016-11-01 MED ORDER — METHYLPREDNISOLONE ACETATE 80 MG/ML IJ SUSP
80.0000 mg | Freq: Once | INTRAMUSCULAR | Status: AC
  Administered 2016-11-01: 80 mg via INTRAMUSCULAR

## 2016-11-01 NOTE — Addendum Note (Signed)
Addended by: Abner GreenspanHUDY, Marika Mahaffy H on: 11/01/2016 07:53 AM   Modules accepted: Orders

## 2016-11-02 ENCOUNTER — Other Ambulatory Visit: Payer: Self-pay | Admitting: Family Medicine

## 2016-11-02 ENCOUNTER — Telehealth: Payer: Self-pay

## 2016-11-02 DIAGNOSIS — Z1231 Encounter for screening mammogram for malignant neoplasm of breast: Secondary | ICD-10-CM

## 2016-11-02 NOTE — Telephone Encounter (Signed)
Pt calling to set up TCS.She said that Dr.Simpson told her to call us

## 2016-11-07 ENCOUNTER — Ambulatory Visit (HOSPITAL_COMMUNITY): Payer: 59

## 2016-11-08 NOTE — Telephone Encounter (Signed)
Tried to call with no answer . LMOM to call back 

## 2016-11-08 NOTE — Telephone Encounter (Signed)
Forwarding to Ginger who is working on this.  

## 2016-11-09 NOTE — Telephone Encounter (Signed)
Ok to schedule.

## 2016-11-09 NOTE — Telephone Encounter (Addendum)
Gastroenterology Pre-Procedure Review  Request Date: Requesting Physician: Dr.Simpson  FIRIST TIME SCREENING TCS  PATIENT REVIEW QUESTIONS: The patient responded to the following health history questions as indicated:    1. Diabetes Melitis: NO 2. Joint replacements in the past 12 months: NO 3. Major health problems in the past 3 months: NO 4. Has an artificial valve or MVP: NO 5. Has a defibrillator: NO 6. Has been advised in past to take antibiotics in advance of a procedure like teeth cleaning: YES 7. Family history of colon cancer: NO 8. Alcohol Use: NO 9. History of sleep apnea: NO 10. History of coronary artery or other vascular stents placed within the last 12 months: NO    MEDICATIONS & ALLERGIES:    Patient reports the following regarding taking any blood thinners:   Plavix? NO Aspirin? NO Coumadin? NO Brilinta? NO Xarelto? NO Eliquis? NO Pradaxa? NO Savaysa? NO Effient? NO  Patient confirms/reports the following medications:  Current Outpatient Prescriptions  Medication Sig Dispense Refill  . albuterol (PROAIR HFA) 108 (90 Base) MCG/ACT inhaler Inhale 2 puffs into the lungs every 4 (four) hours as needed for wheezing or shortness of breath. 1 Inhaler 1  . benazepril-hydrochlorthiazide (LOTENSIN HCT) 10-12.5 MG tablet Take 1 tablet by mouth daily. 30 tablet 0  . benzonatate (TESSALON) 100 MG capsule Take 1 capsule (100 mg total) by mouth 2 (two) times daily as needed for cough. 20 capsule 0  . cetirizine (ZYRTEC) 10 MG tablet Take 10 mg by mouth daily.    . Vitamin D, Ergocalciferol, (DRISDOL) 50000 units CAPS capsule TAKE 1 CAPSULE BY MOUTH ONCE A WEEK 4 capsule 5  . azithromycin (ZITHROMAX) 250 MG tablet Two tablets on  Day one , then one tabl;et once daily for an additional four days (Patient not taking: Reported on 11/09/2016) 6 tablet 0  . benazepril-hydrochlorthiazide (LOTENSIN HCT) 10-12.5 MG tablet TAKE 1 TABLET BY MOUTH DAILY. (Patient not taking: Reported on  11/09/2016) 90 tablet 1  . fluticasone (FLONASE) 50 MCG/ACT nasal spray Use 1-2 sprays once daily as needed 16 g 5  . methylPREDNISolone (MEDROL DOSEPAK) 4 MG TBPK tablet tad (Patient not taking: Reported on 11/09/2016) 21 tablet 0  . promethazine-dextromethorphan (PROMETHAZINE-DM) 6.25-15 MG/5ML syrup One teaspoon at bedtime, sa neded, for excessive cough (Patient not taking: Reported on 11/09/2016) 180 mL 0  . sulfamethoxazole-trimethoprim (BACTRIM DS,SEPTRA DS) 800-160 MG tablet Take 1 tablet by mouth 2 (two) times daily.     No current facility-administered medications for this visit.     Patient confirms/reports the following allergies:  No Known Allergies  No orders of the defined types were placed in this encounter.   AUTHORIZATION INFORMATION [place]  [place] [id number],  Group #: [id number] Pre-Cert / Berkley HarveyAuth required:  Pre-Cert / Auth #:   SCHEDULE INFORMATION: Procedure has been scheduled as follows:  Date: , Time:   Location:   This Gastroenterology Pre-Precedure Review Form is being routed to the following provider(s):

## 2016-11-10 NOTE — Telephone Encounter (Signed)
Pt will call me back

## 2016-11-14 ENCOUNTER — Other Ambulatory Visit: Payer: Self-pay

## 2016-11-14 ENCOUNTER — Ambulatory Visit (HOSPITAL_COMMUNITY)
Admission: RE | Admit: 2016-11-14 | Discharge: 2016-11-14 | Disposition: A | Discharge status: Home / Self Care | Payer: 59 | Source: Ambulatory Visit | Attending: Family Medicine | Admitting: Family Medicine

## 2016-11-14 DIAGNOSIS — Z1231 Encounter for screening mammogram for malignant neoplasm of breast: Secondary | ICD-10-CM

## 2016-11-14 DIAGNOSIS — Z1211 Encounter for screening for malignant neoplasm of colon: Secondary | ICD-10-CM

## 2016-11-14 MED ORDER — PEG 3350-KCL-NA BICARB-NACL 420 G PO SOLR: 4000.0000 mL | ORAL | 0 refills | Status: DC

## 2016-11-14 NOTE — Telephone Encounter (Signed)
Pt is set up for TCS on 12/26/16 @ 8:30 am. Instructions are in the mail.

## 2016-11-14 NOTE — Telephone Encounter (Signed)
No PA is needed

## 2016-11-15 ENCOUNTER — Ambulatory Visit: Payer: 59 | Admitting: Family Medicine

## 2016-11-30 ENCOUNTER — Ambulatory Visit: Payer: 59 | Attending: Family Medicine

## 2016-12-21 ENCOUNTER — Telehealth: Payer: Self-pay | Admitting: Gastroenterology

## 2016-12-21 NOTE — Telephone Encounter (Signed)
Called and informed pt that her prep prescription was [facility]14[facility]. She said she will call to see if they have her prescription.

## 2016-12-21 NOTE — Telephone Encounter (Signed)
Pt is scheduled of a colonoscopy on Monday with SF and she wants her prep prescription called into  [facility] (she does not know the number). She is going to call back around 3pm today to see if it's been called in.

## 2016-12-21 NOTE — Telephone Encounter (Signed)
Patient called and requested to get a copy of her prep instructions.  The instructions have been printed and placed at the front desk for her to pick up.

## 2016-12-26 ENCOUNTER — Ambulatory Visit (HOSPITAL_COMMUNITY)
Admission: RE | Admit: 2016-12-26 | Discharge: 2016-12-26 | Disposition: A | Discharge status: Home / Self Care | Payer: 59 | Source: Ambulatory Visit | Attending: Gastroenterology | Admitting: Gastroenterology

## 2016-12-26 ENCOUNTER — Encounter (HOSPITAL_COMMUNITY): Payer: Self-pay | Admitting: *Deleted

## 2016-12-26 ENCOUNTER — Encounter (HOSPITAL_COMMUNITY)
Admission: RE | Disposition: A | Discharge status: Home / Self Care | Payer: Self-pay | Source: Ambulatory Visit | Attending: Gastroenterology

## 2016-12-26 DIAGNOSIS — Z1211 Encounter for screening for malignant neoplasm of colon: Secondary | ICD-10-CM | POA: Diagnosis not present

## 2016-12-26 DIAGNOSIS — J45909 Unspecified asthma, uncomplicated: Secondary | ICD-10-CM | POA: Insufficient documentation

## 2016-12-26 DIAGNOSIS — K648 Other hemorrhoids: Secondary | ICD-10-CM | POA: Insufficient documentation

## 2016-12-26 DIAGNOSIS — Q438 Other specified congenital malformations of intestine: Secondary | ICD-10-CM | POA: Diagnosis not present

## 2016-12-26 DIAGNOSIS — I10 Essential (primary) hypertension: Secondary | ICD-10-CM | POA: Diagnosis not present

## 2016-12-26 DIAGNOSIS — K644 Residual hemorrhoidal skin tags: Secondary | ICD-10-CM | POA: Insufficient documentation

## 2016-12-26 DIAGNOSIS — Z1212 Encounter for screening for malignant neoplasm of rectum: Secondary | ICD-10-CM

## 2016-12-26 HISTORY — PX: COLONOSCOPY: SHX5424

## 2016-12-26 SURGERY — COLONOSCOPY
Anesthesia: Moderate Sedation

## 2016-12-26 MED ORDER — MEPERIDINE HCL 100 MG/ML IJ SOLN
INTRAMUSCULAR | Status: DC | PRN
  Administered 2016-12-26 (×3): 25 mg via INTRAVENOUS

## 2016-12-26 MED ORDER — MIDAZOLAM HCL 5 MG/5ML IJ SOLN
INTRAMUSCULAR | Status: DC | PRN
  Administered 2016-12-26 (×2): 1 mg via INTRAVENOUS
  Administered 2016-12-26: 2 mg via INTRAVENOUS

## 2016-12-26 MED ORDER — STERILE WATER FOR IRRIGATION IR SOLN
Status: DC | PRN
  Administered 2016-12-26: 100 mL

## 2016-12-26 MED ORDER — MIDAZOLAM HCL 5 MG/5ML IJ SOLN
INTRAMUSCULAR | Status: AC
  Filled 2016-12-26: qty 10

## 2016-12-26 MED ORDER — SODIUM CHLORIDE 0.9 % IV SOLN
INTRAVENOUS | Status: DC
  Administered 2016-12-26: 08:00:00 via INTRAVENOUS

## 2016-12-26 MED ORDER — MEPERIDINE HCL 100 MG/ML IJ SOLN
INTRAMUSCULAR | Status: AC
  Filled 2016-12-26: qty 2

## 2016-12-26 NOTE — H&P (Signed)
Primary Care Physician:  Kerri PerchesSimpson, Margaret E, MD Primary Gastroenterologist:  Dr. Darrick PennaFields  Pre-Procedure History & Physical: HPI:  [name] [name] is a 61 y.o. female here for COLON CANCER SCREENING.  Past Medical History:  Diagnosis Date  . Asthma   . Hypertension     Past Surgical History:  Procedure Laterality Date  . NO PAST SURGERIES      Prior to Admission medications   Medication Sig Start Date End Date Taking? Authorizing Provider  benazepril-hydrochlorthiazide (LOTENSIN HCT) 10-12.5 MG tablet TAKE 1 TABLET BY MOUTH DAILY. 07/14/16  Yes Kerri PerchesSimpson, Margaret E, MD  ibuprofen (ADVIL,MOTRIN) 200 MG tablet Take 200 mg by mouth daily as needed for headache or moderate pain.   Yes [provider]  oxymetazoline (AFRIN) 0.05 % nasal spray Place 1 spray into both nostrils daily as needed for congestion.   Yes [provider]  polyethylene glycol-electrolytes (TRILYTE) 420 g solution Take 4,000 mLs by mouth as directed. 11/14/16  Yes Fields, Darleene CleaverSandi L, MD  albuterol (PROAIR HFA) 108 (90 Base) MCG/ACT inhaler Inhale 2 puffs into the lungs every 4 (four) hours as needed for wheezing or shortness of breath. 07/05/16   Alfonse SpruceGallagher, Joel Louis, MD  azithromycin [facility](ZITHROMAX) 250 MG tablet Two tablets on  Day one , then one tabl;et once daily for an additional four days Patient not taking: Reported on 11/09/2016 10/31/16   Kerri PerchesSimpson, Margaret E, MD  benzonatate (TESSALON) 100 MG capsule Take 1 capsule (100 mg total) by mouth 2 (two) times daily as needed for cough. Patient not taking: Reported on 12/22/2016 10/27/16   Kerri PerchesSimpson, Margaret E, MD  fluticasone Aleda Grana(FLONASE) 50 MCG/ACT nasal spray Use 1-2 sprays once daily as needed Patient not taking: Reported on 12/22/2016 07/05/16   Alfonse SpruceGallagher, Joel Louis, MD  promethazine-dextromethorphan (PROMETHAZINE-DM) 6.25-15 MG/5ML syrup One teaspoon at bedtime, sa neded, for excessive cough Patient not taking: Reported on 11/09/2016 10/31/16   Kerri PerchesSimpson, Margaret E, MD     Allergies as of 11/14/2016  . (No Known Allergies)    Family History  Problem Relation Age of Onset  . Arthritis Mother   . Allergic rhinitis Neg Hx   . Angioedema Neg Hx   . Asthma Neg Hx   . Eczema Neg Hx   . Colon cancer Neg Hx     Social History   Social History  . Marital status: Widowed    Spouse name: N/A  . Number of children: 0  . Years of education: N/A   Occupational History  . CNA     Social History Main Topics  . Smoking status: Never Smoker  . Smokeless tobacco: Never Used  . Alcohol use No  . Drug use: No  . Sexual activity: Not on file   Other Topics Concern  . Not on file   Social History Narrative  . No narrative on file    Review of Systems: See HPI, otherwise negative ROS   Physical Exam: BP 125/84   Pulse 79   Temp 98.2 F (36.8 C) (Oral)   Resp 13   Ht 5\' 4"  (1.626 m)   Wt 140 lb (63.5 kg)   SpO2 99%   BMI 24.03 kg/m  General:   Alert,  pleasant and cooperative in NAD Head:  Normocephalic and atraumatic. Neck:  Supple; Lungs:  Clear throughout to auscultation.    Heart:  Regular rate and rhythm. Abdomen:  Soft, nontender and nondistended. Normal bowel sounds, without guarding, and without rebound.   Neurologic:  Alert  and  oriented x4;  grossly normal neurologically.  Impression/Plan:     SCREENING  Plan:  1. TCS TODAY. DISCUSSED PROCEDURE, BENEFITS, & RISKS: < 1% chance of medication reaction, bleeding, perforation, or rupture of spleen/liver.

## 2016-12-26 NOTE — Discharge Instructions (Signed)
You have moderate size internal  AND EXTERNAL hemorrhoids. YOU DID NOT HAVE ANY POLYPS.   DRINK WATER TO KEEP YOUR URINE LIGHT YELLOW.  FOLLOW A HIGH FIBER DIET. AVOID ITEMS THAT CAUSE BLOATING. SEE INFO BELOW.  USE PREPARATION H FOUR TIMES  A DAY IF NEEDED TO RELIEVE RECTAL PAIN/PRESSURE/BLEEDING.  Next colonoscopy in 10 years.  Colonoscopy Care After Read the instructions outlined below and refer to this sheet in the next week. These discharge instructions provide you with general information on caring for yourself after you leave the hospital. While your treatment has been planned according to the most current medical practices available, unavoidable complications occasionally occur. If you have any problems or questions after discharge, call DR. Ailen Strauch, [phone].  ACTIVITY  You may resume your regular activity, but move at a slower pace for the next 24 hours.   Take frequent rest periods for the next 24 hours.   Walking will help get rid of the air and reduce the bloated feeling in your belly (abdomen).   No driving for 24 hours (because of the medicine (anesthesia) used during the test).   You may shower.   Do not sign any important legal documents or operate any machinery for 24 hours (because of the anesthesia used during the test).    NUTRITION  Drink plenty of fluids.   You may resume your normal diet as instructed by your doctor.   Begin with a light meal and progress to your normal diet. Heavy or fried foods are harder to digest and may make you feel sick to your stomach (nauseated).   Avoid alcoholic beverages for 24 hours or as instructed.    MEDICATIONS  You may resume your normal medications.   WHAT YOU CAN EXPECT TODAY  Some feelings of bloating in the abdomen.   Passage of more gas than usual.   Spotting of blood in your stool or on the toilet paper  .  IF YOU HAD POLYPS REMOVED DURING THE COLONOSCOPY:  Eat a soft diet IF YOU HAVE NAUSEA,  BLOATING, ABDOMINAL PAIN, OR VOMITING.    FINDING OUT THE RESULTS OF YOUR TEST Not all test results are available during your visit. DR. Darrick Penna WILL CALL YOU WITHIN 14 DAYS OF YOUR PROCEDUE WITH YOUR RESULTS. Do not assume everything is normal if you have not heard from DR. Viveka Wilmeth, CALL HER OFFICE AT [phone].  SEEK IMMEDIATE MEDICAL ATTENTION AND CALL THE OFFICE: [phone] IF:  You have more than a spotting of blood in your stool.   Your belly is swollen (abdominal distention).   You are nauseated or vomiting.   You have a temperature over 101F.   You have abdominal pain or discomfort that is severe or gets worse throughout the day.  High-Fiber Diet A high-fiber diet changes your normal diet to include more whole grains, legumes, fruits, and vegetables. Changes in the diet involve replacing refined carbohydrates with unrefined foods. The calorie level of the diet is essentially unchanged. The Dietary Reference Intake (recommended amount) for adult males is 38 grams per day. For adult females, it is 25 grams per day. Pregnant and lactating women should consume 28 grams of fiber per day. Fiber is the intact part of a plant that is not broken down during digestion. Functional fiber is fiber that has been isolated from the plant to provide a beneficial effect in the body. PURPOSE  Increase stool bulk.   Ease and regulate bowel movements.   Lower cholesterol.   REDUCE  RISK OF COLON CANCER  INDICATIONS THAT YOU NEED MORE FIBER  Constipation and hemorrhoids.   Uncomplicated diverticulosis (intestine condition) and irritable bowel syndrome.   Weight management.   As a protective measure against hardening of the arteries (atherosclerosis), diabetes, and cancer.   GUIDELINES FOR INCREASING FIBER IN THE DIET  Start adding fiber to the diet slowly. A gradual increase of about 5 more grams (2 slices of whole-wheat bread, 2 servings of most fruits or vegetables, or 1 bowl of  high-fiber cereal) per day is best. Too rapid an increase in fiber may result in constipation, flatulence, and bloating.   Drink enough water and fluids to keep your urine clear or pale yellow. Water, juice, or caffeine-free drinks are recommended. Not drinking enough fluid may cause constipation.   Eat a variety of high-fiber foods rather than one type of fiber.   Try to increase your intake of fiber through using high-fiber foods rather than fiber pills or supplements that contain small amounts of fiber.   The goal is to change the types of food eaten. Do not supplement your present diet with high-fiber foods, but replace foods in your present diet.    INCLUDE A VARIETY OF FIBER SOURCES  Replace refined and processed grains with whole grains, canned fruits with fresh fruits, and incorporate other fiber sources. White rice, white breads, and most bakery goods contain little or no fiber.   Brown whole-grain rice, buckwheat oats, and many fruits and vegetables are all good sources of fiber. These include: broccoli, Brussels sprouts, cabbage, cauliflower, beets, sweet potatoes, white potatoes (skin on), carrots, tomatoes, eggplant, squash, berries, fresh fruits, and dried fruits.   Cereals appear to be the richest source of fiber. Cereal fiber is found in whole grains and bran. Bran is the fiber-rich outer coat of cereal grain, which is largely removed in refining. In whole-grain cereals, the bran remains. In breakfast cereals, the largest amount of fiber is found in those with "bran" in their names. The fiber content is sometimes indicated on the label.   You may need to include additional fruits and vegetables each day.   In baking, for 1 cup white flour, you may use the following substitutions:   1 cup whole-wheat flour minus 2 tablespoons.   1/2 cup white flour plus 1/2 cup whole-wheat flour.   Hemorrhoids Hemorrhoids are dilated (enlarged) veins around the rectum. Sometimes clots will  form in the veins. This makes them swollen and painful. These are called thrombosed hemorrhoids. Causes of hemorrhoids include:  Constipation.   Straining to have a bowel movement.   HEAVY LIFTING  HOME CARE INSTRUCTIONS  Eat a well balanced diet and drink 6 to 8 glasses of water every day to avoid constipation. You may also use a bulk laxative.   Avoid straining to have bowel movements.   Keep anal area dry and clean.   Do not use a donut shaped pillow or sit on the toilet for long periods. This increases blood pooling and pain.   Move your bowels when your body has the urge; this will require less straining and will decrease pain and pressure.

## 2016-12-26 NOTE — Op Note (Signed)
[facility] Patient Name: [name] [name] Procedure Date: 12/26/2016 8:26 AM MRN: [id number] Date of Birth: 09-24-1955 Attending MD: Jonette Eva , MD CSN: [id number] Age: 61 Admit Type: Outpatient Procedure:                Colonoscopy, screening Indications:              Screening for colorectal malignant neoplasm Providers:                Jonette Eva, MD, Edrick Kins, RN, Gearldine Shown,                            Technologist Referring MD:             Milus Mallick. Simpson MD, MD Medicines:                Meperidine 75 mg IV, Midazolam 4 mg IV Complications:            No immediate complications. Estimated Blood Loss:     Estimated blood loss: none. Procedure:                Pre-Anesthesia Assessment:                           - Prior to the procedure, a History and Physical                            was performed, and patient medications and                            allergies were reviewed. The patient's tolerance of                            previous anesthesia was also reviewed. The risks                            and benefits of the procedure and the sedation                            options and risks were discussed with the patient.                            All questions were answered, and informed consent                            was obtained. Prior Anticoagulants: The patient has                            taken ibuprofen, last dose was more than 4 weeks                            prior to procedure. ASA Grade Assessment: I - A                            normal, healthy patient. After reviewing the risks  and benefits, the patient was deemed in                            satisfactory condition to undergo the procedure.                            After obtaining informed consent, the colonoscope                            was passed under direct vision. Throughout the                            procedure, the patient's blood pressure, pulse,  and                            oxygen saturations were monitored continuously. The                            EC-[id number] ([id number]) scope was introduced through                            the anus and advanced to the the cecum, identified                            by appendiceal orifice and ileocecal valve. The                            colonoscopy was somewhat difficult due to a                            tortuous colon. Successful completion of the                            procedure was aided by COLOWRAP. The patient                            tolerated the procedure well. The quality of the                            bowel preparation was excellent. The ileocecal                            valve, appendiceal orifice, and rectum were                            photographed. Scope In: 8:54:21 AM Scope Out: 9:08:49 AM Scope Withdrawal Time: 0 hours 11 minutes 28 seconds  Total Procedure Duration: 0 hours 14 minutes 28 seconds  Findings:      The digital rectal exam was normal.      The recto-sigmoid colon and sigmoid colon were moderately redundant.      External and internal hemorrhoids were found during retroflexion. The       hemorrhoids were moderate. Impression:               -  SLIGHTLY Redundant LEFT colon.                           - External and internal hemorrhoids. Moderate Sedation:      Moderate (conscious) sedation was administered by the endoscopy nurse       and supervised by the endoscopist. The following parameters were       monitored: oxygen saturation, heart rate, blood pressure, and response       to care. Total physician intraservice time was 29 minutes. Recommendation:           - Repeat colonoscopy in 10 years for surveillance.                           - High fiber diet.                           - Continue present medications.                           - Patient has a contact number available for                            emergencies. The signs and  symptoms of potential                            delayed complications were discussed with the                            patient. Return to normal activities tomorrow.                            Written discharge instructions were provided to the                            patient. Procedure Code(s):        --- Professional ---                           [phone], Colonoscopy, flexible; diagnostic, including                            collection of specimen(s) by brushing or washing,                            when performed (separate procedure)                           99152, Moderate sedation services provided by the                            same physician or other qualified health care                            professional performing the diagnostic or  therapeutic service that the sedation supports,                            requiring the presence of an independent trained                            observer to assist in the monitoring of the                            patient's level of consciousness and physiological                            status; initial 15 minutes of intraservice time,                            patient age 6 years or older                           [phone], Moderate sedation services; each additional                            15 minutes intraservice time Diagnosis Code(s):        --- Professional ---                           Z12.11, Encounter for screening for malignant                            neoplasm of colon                           K64.8, Other hemorrhoids                           Q43.8, Other specified congenital malformations of                            intestine CPT copyright 2016 [employer]. All rights reserved. The codes documented in this report are preliminary and upon coder review may  be revised to meet current compliance requirements. Jonette Eva, MD Jonette Eva, MD 12/26/2016 9:18:00  AM This report has been signed electronically. Number of Addenda: 0

## 2016-12-26 NOTE — OR Nursing (Signed)
[name] MarekJanice [name] was at [facility] on 12/26/16 for a procedure and cannot return to work until 1100 on 12/27/16.

## 2016-12-28 ENCOUNTER — Encounter (HOSPITAL_COMMUNITY): Payer: Self-pay | Admitting: Gastroenterology

## 2017-01-18 ENCOUNTER — Telehealth: Payer: Self-pay | Admitting: Family Medicine

## 2017-01-18 ENCOUNTER — Other Ambulatory Visit: Payer: Self-pay | Admitting: Family Medicine

## 2017-01-18 MED ORDER — POTASSIUM CHLORIDE CRYS ER 20 MEQ PO TBCR: 20.0000 meq | EXTENDED_RELEASE_TABLET | Freq: Every day | ORAL | 1 refills | Status: AC

## 2017-01-18 MED ORDER — HYDROCHLOROTHIAZIDE 25 MG PO TABS: 25.0000 mg | ORAL_TABLET | Freq: Every day | ORAL | 1 refills | Status: AC

## 2017-01-18 NOTE — Telephone Encounter (Signed)
pls call pt and schedule a nurse blood pressure check in 5 to 6 weeks

## 2017-01-18 NOTE — Telephone Encounter (Signed)
Patient left message on nurse line stating her blood pressure pills are making her cough. She asks for them to be changed. Callback# [phone]

## 2017-01-18 NOTE — Telephone Encounter (Signed)
I spoke directly wit the pt, she knows to stop the benazepril/ hCTZ and new is hCTZ 25 mg one ddaily and klor con 20 meq one daily, needs to schedule nurse bP check in 5 weeks

## 2017-01-18 NOTE — Telephone Encounter (Signed)
Does take ACE. want to change?

## 2017-01-19 NOTE — Telephone Encounter (Signed)
I called patient to schedule the nurse visit no answer left a message.

## 2017-01-30 DIAGNOSIS — H35031 Hypertensive retinopathy, right eye: Secondary | ICD-10-CM | POA: Diagnosis not present

## 2017-03-01 ENCOUNTER — Emergency Department: Payer: 59

## 2017-03-01 ENCOUNTER — Emergency Department
Admission: EM | Admit: 2017-03-01 | Discharge: 2017-03-02 | Disposition: A | Discharge status: Home / Self Care | Payer: 59 | Attending: Emergency Medicine | Admitting: Emergency Medicine

## 2017-03-01 DIAGNOSIS — Y999 Unspecified external cause status: Secondary | ICD-10-CM | POA: Diagnosis not present

## 2017-03-01 DIAGNOSIS — Y939 Activity, unspecified: Secondary | ICD-10-CM | POA: Diagnosis not present

## 2017-03-01 DIAGNOSIS — Z79899 Other long term (current) drug therapy: Secondary | ICD-10-CM | POA: Diagnosis not present

## 2017-03-01 DIAGNOSIS — S2222XA Fracture of body of sternum, initial encounter for closed fracture: Secondary | ICD-10-CM | POA: Insufficient documentation

## 2017-03-01 DIAGNOSIS — S8991XA Unspecified injury of right lower leg, initial encounter: Secondary | ICD-10-CM | POA: Diagnosis not present

## 2017-03-01 DIAGNOSIS — S8992XA Unspecified injury of left lower leg, initial encounter: Secondary | ICD-10-CM | POA: Insufficient documentation

## 2017-03-01 DIAGNOSIS — M542 Cervicalgia: Secondary | ICD-10-CM | POA: Diagnosis not present

## 2017-03-01 DIAGNOSIS — R079 Chest pain, unspecified: Secondary | ICD-10-CM | POA: Diagnosis not present

## 2017-03-01 DIAGNOSIS — R0600 Dyspnea, unspecified: Secondary | ICD-10-CM | POA: Diagnosis not present

## 2017-03-01 DIAGNOSIS — Y929 Unspecified place or not applicable: Secondary | ICD-10-CM | POA: Insufficient documentation

## 2017-03-01 DIAGNOSIS — I10 Essential (primary) hypertension: Secondary | ICD-10-CM | POA: Diagnosis not present

## 2017-03-01 DIAGNOSIS — J45909 Unspecified asthma, uncomplicated: Secondary | ICD-10-CM | POA: Diagnosis not present

## 2017-03-01 DIAGNOSIS — M549 Dorsalgia, unspecified: Secondary | ICD-10-CM | POA: Insufficient documentation

## 2017-03-01 DIAGNOSIS — S20309A Unspecified superficial injuries of unspecified front wall of thorax, initial encounter: Secondary | ICD-10-CM | POA: Diagnosis present

## 2017-03-01 LAB — CBC
HEMATOCRIT: 41.2 % (ref 35.0–47.0)
Hemoglobin: 13.5 g/dL (ref 12.0–16.0)
MCH: 26.5 pg (ref 26.0–34.0)
MCHC: 32.7 g/dL (ref 32.0–36.0)
MCV: 81 fL (ref 80.0–100.0)
PLATELETS: 436 10*3/uL (ref 150–440)
RBC: 5.09 MIL/uL (ref 3.80–5.20)
RDW: 14.9 % — AB (ref 11.5–14.5)
WBC: 12 10*3/uL — ABNORMAL HIGH (ref 3.6–11.0)

## 2017-03-01 MED ORDER — IOPAMIDOL (ISOVUE-300) INJECTION 61%
100.0000 mL | Freq: Once | INTRAVENOUS | Status: AC | PRN
  Administered 2017-03-01: 100 mL via INTRAVENOUS

## 2017-03-01 MED ORDER — MORPHINE SULFATE (PF) 2 MG/ML IV SOLN
INTRAVENOUS | Status: AC
  Administered 2017-03-01: 2 mg via INTRAVENOUS
  Filled 2017-03-01: qty 1

## 2017-03-01 MED ORDER — ONDANSETRON HCL 4 MG/2ML IJ SOLN
INTRAMUSCULAR | Status: AC
  Administered 2017-03-01
  Filled 2017-03-01: qty 2
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
## 2017-03-02 ENCOUNTER — Telehealth: Payer: Self-pay | Admitting: Family Medicine

## 2017-03-02 ENCOUNTER — Emergency Department: Payer: 59

## 2017-03-02 DIAGNOSIS — S8992XA Unspecified injury of left lower leg, initial encounter: Secondary | ICD-10-CM | POA: Diagnosis not present

## 2017-03-02 DIAGNOSIS — S8991XA Unspecified injury of right lower leg, initial encounter: Secondary | ICD-10-CM | POA: Diagnosis not present

## 2017-03-02 DIAGNOSIS — M25561 Pain in right knee: Secondary | ICD-10-CM | POA: Diagnosis not present

## 2017-03-02 DIAGNOSIS — S0990XA Unspecified injury of head, initial encounter: Secondary | ICD-10-CM | POA: Diagnosis not present

## 2017-03-02 DIAGNOSIS — R0600 Dyspnea, unspecified: Secondary | ICD-10-CM | POA: Diagnosis not present

## 2017-03-02 DIAGNOSIS — R51 Headache: Secondary | ICD-10-CM | POA: Diagnosis not present

## 2017-03-02 DIAGNOSIS — S2222XA Fracture of body of sternum, initial encounter for closed fracture: Secondary | ICD-10-CM | POA: Diagnosis not present

## 2017-03-02 DIAGNOSIS — M549 Dorsalgia, unspecified: Secondary | ICD-10-CM | POA: Diagnosis not present

## 2017-03-02 DIAGNOSIS — S199XXA Unspecified injury of neck, initial encounter: Secondary | ICD-10-CM | POA: Diagnosis not present

## 2017-03-02 DIAGNOSIS — I10 Essential (primary) hypertension: Secondary | ICD-10-CM | POA: Diagnosis not present

## 2017-03-02 DIAGNOSIS — S3991XA Unspecified injury of abdomen, initial encounter: Secondary | ICD-10-CM | POA: Diagnosis not present

## 2017-03-02 DIAGNOSIS — M542 Cervicalgia: Secondary | ICD-10-CM | POA: Diagnosis not present

## 2017-03-02 DIAGNOSIS — J45909 Unspecified asthma, uncomplicated: Secondary | ICD-10-CM | POA: Diagnosis not present

## 2017-03-02 DIAGNOSIS — M25562 Pain in left knee: Secondary | ICD-10-CM | POA: Diagnosis not present

## 2017-03-02 DIAGNOSIS — J9811 Atelectasis: Secondary | ICD-10-CM | POA: Diagnosis not present

## 2017-03-02 DIAGNOSIS — Z79899 Other long term (current) drug therapy: Secondary | ICD-10-CM | POA: Diagnosis not present

## 2017-03-02 LAB — COMPREHENSIVE METABOLIC PANEL
ALK PHOS: 73 U/L (ref 38–126)
ALT: 12 U/L — ABNORMAL LOW (ref 14–54)
AST: 34 U/L (ref 15–41)
Albumin: 3.9 g/dL (ref 3.5–5.0)
Anion gap: 8 (ref 5–15)
BILIRUBIN TOTAL: 0.7 mg/dL (ref 0.3–1.2)
BUN: 15 mg/dL (ref 6–20)
CALCIUM: 9.5 mg/dL (ref 8.9–10.3)
CO2: 30 mmol/L (ref 22–32)
Chloride: 101 mmol/L (ref 101–111)
Creatinine, Ser: 0.79 mg/dL (ref 0.44–1.00)
GFR calc Af Amer: >60 mL/min (ref >60)
GLUCOSE: 101 mg/dL — AB (ref 65–99)
POTASSIUM: 4.3 mmol/L (ref 3.5–5.1)
Sodium: 139 mmol/L (ref 135–145)
TOTAL PROTEIN: 8.6 g/dL — AB (ref 6.5–8.1)

## 2017-03-02 MED ORDER — OXYCODONE-ACETAMINOPHEN 5-325 MG PO TABS
2.0000 | ORAL_TABLET | Freq: Once | ORAL | Status: AC
  Administered 2017-03-02: 2 via ORAL
  Filled 2017-03-02: qty 2

## 2017-03-02 MED ORDER — OXYCODONE-ACETAMINOPHEN 5-325 MG PO TABS: 1.0000 | ORAL_TABLET | ORAL | 0 refills | Status: AC | PRN
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
## 2017-03-06 ENCOUNTER — Telehealth: Payer: Self-pay

## 2017-03-06 ENCOUNTER — Ambulatory Visit: Payer: 59 | Admitting: Family Medicine

## 2017-03-06 ENCOUNTER — Telehealth: Payer: Self-pay | Admitting: Family Medicine

## 2017-03-06 DIAGNOSIS — M25561 Pain in right knee: Secondary | ICD-10-CM | POA: Diagnosis not present

## 2017-03-06 DIAGNOSIS — M25562 Pain in left knee: Secondary | ICD-10-CM | POA: Diagnosis not present
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
## 2017-03-07 DIAGNOSIS — M25561 Pain in right knee: Secondary | ICD-10-CM | POA: Diagnosis not present

## 2017-03-07 DIAGNOSIS — Z789 Other specified health status: Secondary | ICD-10-CM | POA: Diagnosis not present

## 2017-03-07 DIAGNOSIS — I10 Essential (primary) hypertension: Secondary | ICD-10-CM | POA: Diagnosis not present

## 2017-03-07 DIAGNOSIS — S2220XA Unspecified fracture of sternum, initial encounter for closed fracture: Secondary | ICD-10-CM | POA: Diagnosis not present

## 2017-03-07 DIAGNOSIS — Z299 Encounter for prophylactic measures, unspecified: Secondary | ICD-10-CM | POA: Diagnosis not present

## 2017-03-07 DIAGNOSIS — Z6828 Body mass index (BMI) 28.0-28.9, adult: Secondary | ICD-10-CM | POA: Diagnosis not present

## 2017-03-07 DIAGNOSIS — M25562 Pain in left knee: Secondary | ICD-10-CM | POA: Diagnosis not present

## 2017-03-14 ENCOUNTER — Other Ambulatory Visit: Payer: Self-pay | Admitting: Orthopedic Surgery

## 2017-03-14 DIAGNOSIS — M25561 Pain in right knee: Secondary | ICD-10-CM

## 2017-03-15 DIAGNOSIS — Z299 Encounter for prophylactic measures, unspecified: Secondary | ICD-10-CM | POA: Diagnosis not present

## 2017-03-15 DIAGNOSIS — Z6827 Body mass index (BMI) 27.0-27.9, adult: Secondary | ICD-10-CM | POA: Diagnosis not present

## 2017-03-15 DIAGNOSIS — Z713 Dietary counseling and surveillance: Secondary | ICD-10-CM | POA: Diagnosis not present

## 2017-03-15 DIAGNOSIS — S2220XA Unspecified fracture of sternum, initial encounter for closed fracture: Secondary | ICD-10-CM | POA: Diagnosis not present

## 2017-03-20 ENCOUNTER — Ambulatory Visit
Admission: RE | Admit: 2017-03-20 | Discharge: 2017-03-20 | Disposition: A | Discharge status: Home / Self Care | Payer: 59 | Source: Ambulatory Visit | Attending: Orthopedic Surgery | Admitting: Orthopedic Surgery

## 2017-03-20 DIAGNOSIS — M1711 Unilateral primary osteoarthritis, right knee: Secondary | ICD-10-CM | POA: Diagnosis not present

## 2017-03-20 DIAGNOSIS — M25561 Pain in right knee: Secondary | ICD-10-CM | POA: Diagnosis not present

## 2017-03-22 DIAGNOSIS — M5031 Other cervical disc degeneration,  high cervical region: Secondary | ICD-10-CM | POA: Diagnosis not present

## 2017-03-22 DIAGNOSIS — M542 Cervicalgia: Secondary | ICD-10-CM | POA: Diagnosis not present

## 2017-03-22 DIAGNOSIS — M25562 Pain in left knee: Secondary | ICD-10-CM | POA: Diagnosis not present

## 2017-03-22 DIAGNOSIS — I10 Essential (primary) hypertension: Secondary | ICD-10-CM | POA: Diagnosis not present

## 2017-03-22 DIAGNOSIS — M25561 Pain in right knee: Secondary | ICD-10-CM | POA: Diagnosis not present

## 2017-03-22 DIAGNOSIS — Z299 Encounter for prophylactic measures, unspecified: Secondary | ICD-10-CM | POA: Diagnosis not present

## 2017-03-22 DIAGNOSIS — M50323 Other cervical disc degeneration at C6-C7 level: Secondary | ICD-10-CM | POA: Diagnosis not present

## 2017-03-22 DIAGNOSIS — Z6827 Body mass index (BMI) 27.0-27.9, adult: Secondary | ICD-10-CM | POA: Diagnosis not present

## 2017-03-23 DIAGNOSIS — M25562 Pain in left knee: Secondary | ICD-10-CM | POA: Diagnosis not present

## 2017-03-23 DIAGNOSIS — S9400XD Injury of lateral plantar nerve, unspecified leg, subsequent encounter: Secondary | ICD-10-CM | POA: Diagnosis not present

## 2017-03-23 DIAGNOSIS — M25561 Pain in right knee: Secondary | ICD-10-CM | POA: Diagnosis not present

## 2017-04-07 DIAGNOSIS — M25561 Pain in right knee: Secondary | ICD-10-CM | POA: Diagnosis not present

## 2017-04-10 DIAGNOSIS — S2220XA Unspecified fracture of sternum, initial encounter for closed fracture: Secondary | ICD-10-CM | POA: Diagnosis not present

## 2017-04-10 DIAGNOSIS — Z713 Dietary counseling and surveillance: Secondary | ICD-10-CM | POA: Diagnosis not present

## 2017-04-10 DIAGNOSIS — Z299 Encounter for prophylactic measures, unspecified: Secondary | ICD-10-CM | POA: Diagnosis not present

## 2017-04-10 DIAGNOSIS — Z6828 Body mass index (BMI) 28.0-28.9, adult: Secondary | ICD-10-CM | POA: Diagnosis not present

## 2017-04-25 ENCOUNTER — Encounter (HOSPITAL_COMMUNITY): Payer: Self-pay | Admitting: Physical Therapy

## 2017-04-25 ENCOUNTER — Ambulatory Visit (HOSPITAL_COMMUNITY): Payer: 59 | Attending: Orthopedic Surgery | Admitting: Physical Therapy

## 2017-04-25 DIAGNOSIS — M6281 Muscle weakness (generalized): Secondary | ICD-10-CM | POA: Insufficient documentation

## 2017-04-25 DIAGNOSIS — M25561 Pain in right knee: Secondary | ICD-10-CM | POA: Diagnosis not present

## 2017-04-25 DIAGNOSIS — R262 Difficulty in walking, not elsewhere classified: Secondary | ICD-10-CM | POA: Diagnosis not present

## 2017-04-25 NOTE — Patient Instructions (Addendum)
Knee Extension (Sitting)    Place __3_ pound weight on right ankle  and straighten knee fully, lower slowly. Repeat _10___ times per set. Do _1___ sets per session. Do _2___ sessions per day. Repeat to left  http://orth.exer.us/732   Copyright  VHI. All rights reserved.  Strengthening: Hip Abduction (Side-Lying)    Tighten muscles on front of right thigh, then lift leg _10___ inches from surface, keeping knee locked.  Repeat __10__ times per set. Do __1__ sets per session. Do ___2_ sessions per day. Repeat to left  http://orth.exer.us/622   Copyright  VHI. All rights reserved.  Self-Mobilization: Knee Flexion (Prone)   Put 3 lb. On your ankle.  Bring right  heel toward buttocks as close as possible. Hold _3___ seconds. Relax. Repeat _10___ times per set. Do _1___ sets per session. Do _2___ sessions per day. Repeat to left  http://orth.exer.us/596   Copyright  VHI. All rights reserved.  Strengthening: Hip Extension (Prone)    Tighten muscles on front of right and  left thigh, then lift leg __3__ inches from surface, keeping knee locked. Repeat _10___ times per set. Do 1____ sets per session. Do _2___ sessions per day.  http://orth.exer.us/620   Copyright  VHI. All rights reserved.
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
## 2017-04-27 ENCOUNTER — Telehealth (HOSPITAL_COMMUNITY): Payer: Self-pay | Admitting: Physical Therapy

## 2017-04-27 ENCOUNTER — Ambulatory Visit (HOSPITAL_COMMUNITY): Payer: 59 | Admitting: Physical Therapy

## 2017-04-27 NOTE — Telephone Encounter (Signed)
Pt called and states she is not feeling like coming today

## 2017-05-01 ENCOUNTER — Ambulatory Visit (HOSPITAL_COMMUNITY): Payer: 59 | Admitting: Physical Therapy

## 2017-05-02 DIAGNOSIS — S2220XA Unspecified fracture of sternum, initial encounter for closed fracture: Secondary | ICD-10-CM | POA: Diagnosis not present

## 2017-05-02 DIAGNOSIS — I10 Essential (primary) hypertension: Secondary | ICD-10-CM | POA: Diagnosis not present

## 2017-05-02 DIAGNOSIS — M25561 Pain in right knee: Secondary | ICD-10-CM | POA: Diagnosis not present

## 2017-05-02 DIAGNOSIS — Z6828 Body mass index (BMI) 28.0-28.9, adult: Secondary | ICD-10-CM | POA: Diagnosis not present

## 2017-05-02 DIAGNOSIS — M25562 Pain in left knee: Secondary | ICD-10-CM | POA: Diagnosis not present

## 2017-05-02 DIAGNOSIS — Z299 Encounter for prophylactic measures, unspecified: Secondary | ICD-10-CM | POA: Diagnosis not present

## 2017-05-03 ENCOUNTER — Ambulatory Visit (HOSPITAL_COMMUNITY): Payer: 59 | Admitting: Physical Therapy

## 2017-05-03 ENCOUNTER — Encounter (HOSPITAL_COMMUNITY): Payer: Self-pay | Admitting: Physical Therapy

## 2017-05-03 DIAGNOSIS — M6281 Muscle weakness (generalized): Secondary | ICD-10-CM

## 2017-05-03 DIAGNOSIS — M25561 Pain in right knee: Secondary | ICD-10-CM | POA: Diagnosis not present

## 2017-05-03 DIAGNOSIS — R262 Difficulty in walking, not elsewhere classified: Secondary | ICD-10-CM

## 2017-05-03 NOTE — Patient Instructions (Addendum)
Heel Raise: Bilateral (Standing)   Hands on the counter  Rise on balls of feet. Repeat _10___ times per set. Do __1__ sets per session. Do _2___ sessions per day.  http://orth.exer.us/38   Copyright  VHI. All rights reserved.  Functional Quadriceps: Chair Squat    Keeping feet flat on floor, shoulder width apart, squat as low as is comfortable. Use support as necessary. Repeat __10__ times per set. Do __1__ sets per session. Do _2___ sessions per day.  http://orth.exer.us/736   Copyright  VHI. All rights reserved.  Knee Flexion: Resisted (Standing)    With support, __3__ pound weight around right ankle, slowly bend knee up. Return slowly.  Repeat _10___ times per set. Do __1__ sets per session. Do __2__ sessions per day.  http://orth.exer.us/740   Copyright  VHI. All rights reserved.  Step-Down / Step-Up    Stand on stair step or __6__ inch stool. Slowly bend left leg, lowering other foot to floor. Return by straightening front leg. Repeat __10__ times per set. Do 1____ sets per session. Do 2____ sessions per day.  http://orth.exer.us/684   Copyright  VHI. All rights reserved.

## 2017-05-03 NOTE — Therapy (Signed)
[facility] [address] [place], [place], [address] Phone: [phone]   Fax:  [phone]  Physical Therapy Treatment  Patient Details  Name: [name] [name] MRN: [id number] Date of Birth: [birth year] Referring Provider: Daisy Lazar   Encounter Date: 05/03/2017  PT End of Session - 05/03/17 0902    Visit Number  2    Number of Visits  12    Date for PT Re-Evaluation  05/25/17    Authorization Type  [employer] employee    Authorization - Visit Number  1    Authorization - Number of Visits  12    PT Start Time  0818    PT Stop Time  0859    PT Time Calculation (min)  41 min    Activity Tolerance  Patient tolerated treatment well    Behavior During Therapy  [facility] for tasks assessed/performed       Past Medical History:  Diagnosis Date  . Asthma   . Hypertension     Past Surgical History:  Procedure Laterality Date  . COLONOSCOPY N/A 12/26/2016   Procedure: COLONOSCOPY;  Surgeon: West Bali, MD;  [facility];  Service: Endoscopy;  Laterality: N/A;  830   . NO PAST SURGERIES      There were no vitals filed for this visit.  Subjective Assessment - 05/03/17 0819    Subjective  Pt states that she is doing her exercises at home.  She is still having pain.     Pertinent History  HTN,     How long can you sit comfortably?  Able to sit for 45 minutes     How long can you stand comfortably?  Able to stand for 45 minutes     How long can you walk comfortably?  walking with a walker or a cane; pt has not walked for any significant period of time.  If she does her knee swells     Patient Stated Goals  walk better, sleep better ( knee wakes her up 2x a week due to knee pain) , less pain.    Pain Score  4     Pain Location  Knee    Pain Orientation  Right    Pain Descriptors / Indicators  Aching    Pain Onset  More than a month ago                      [facility] Adult PT Treatment/Exercise - 05/03/17 0001       Ambulation/Gait   Ambulation/Gait  Yes    Ambulation/Gait Assistance  6: Modified independent (Device/Increase time)    Ambulation Distance (Feet)  244 Feet    Assistive device  None    Gait Pattern  Step-through pattern    Gait velocity  slow       Knee/Hip Exercises: Stretches   Passive Hamstring Stretch  Right;3 reps;30 seconds    Gastroc Stretch  -- slant board 3 x 30"       Knee/Hip Exercises: Standing   Heel Raises  Both;10 reps    Knee Flexion  Strengthening;Right;10 reps 3#    Lateral Step Up  Both;10 reps    Forward Step Up  Both;10 reps    Rocker Board  2 minutes    SLS  x 5 B       Knee/Hip Exercises: Seated   Sit to Sand  10 reps  PT Education - 05/03/17 0858    Education provided  Yes    Education Details  updated HEP    Person(s) Educated  Patient    Methods  Explanation       PT Short Term Goals - 05/03/17 0906      PT SHORT TERM GOAL #1   Title  Pt to be walking in her home without an assistive device.     Time  3    Period  Weeks    Status  On-going      PT SHORT TERM GOAL #2   Title  Pt  LE strength to be increased 1/2 grade to allow pt to be able to ascend and descend 8 steps with a hand rail     Time  3    Period  Weeks    Status  On-going      PT SHORT TERM GOAL #3   Title  Pt single leg stance on both legs to increase to 30 seconds for improved balance on uneven terrain.     Time  3    Period  Weeks    Status  On-going      PT SHORT TERM GOAL #4   Title  Pain level in pt Rt knee to be no greater than a 2/10 so that pt is only waking one time throughout a whole week.     Status  On-going        PT Long Term Goals - 05/03/17 0906      PT LONG TERM GOAL #1   Title  Pt to be walking without an assistive device outside     Time  6    Period  Weeks    Status  On-going      PT LONG TERM GOAL #2   Title  PT LE strength to be increased one grade to allow her to push herself up from a squatted postiion for work  duties     Time  6    Period  Weeks    Status  On-going      PT LONG TERM GOAL #3   Title  PT to be able to walk at least 450 feet without an assistive device in a three minute period of time for more functional walking.     Time  6    Period  Weeks    Status  On-going      PT LONG TERM GOAL #4   Title  Pt right knee pain to be no greater than a 1/10 to allow pt to sleep the whole night without difficulty.     Time  6    Period  Weeks    Status  On-going            Plan - 05/03/17 1610    Clinical Impression Statement  Reviewed evaluation and goals with patient.  Updated HEP to include functional strengthening exercises.  PT tolerated new exercises well.      Rehab Potential  Good    PT Frequency  2x / week    PT Duration  6 weeks    PT Treatment/Interventions  Therapeutic activities;Therapeutic exercise;Gait training;Stair training;Functional mobility training;Balance training;Manual techniques    PT Next Visit Plan  Continue gait without assistive device.  Begin lunging, step down and sidestep with tband.      PT Home Exercise Plan  LAQ, sidelying abduction, prone knee flexion, prone hip extension.      Consulted and  Agree with Plan of Care  Patient       Patient will benefit from skilled therapeutic intervention in order to improve the following deficits and impairments:  Abnormal gait, Decreased activity tolerance, Decreased balance, Decreased strength, Difficulty walking, Pain  Visit Diagnosis: Acute pain of right knee  Muscle weakness (generalized)  Difficulty in walking, not elsewhere classified     Problem List Patient Active Problem List   Diagnosis Date Noted  . Acute bronchitis with asthma 10/31/2016  . Perennial allergic rhinitis 07/05/2016  . Chronic urticaria 07/05/2016  . Mild intermittent asthma, uncomplicated 07/05/2016  . Intrinsic asthma 03/06/2016  . Special screening for malignant neoplasms, colon 05/30/2015  . Postmenopausal atrophic  vaginitis 10/23/2013  . Prediabetes 11/22/2011  . Hyperpigmentation of skin 09/28/2011  . Seasonal allergies 03/15/2011  . Essential hypertension 12/01/2009  . Overweight 09/16/2008  . CONTACT DERMATITIS&OTHER ECZEMA DUE TO PLANTS 09/16/2008    Virgina Organ, PT CLT [phone] 05/03/2017, 9:07 AM  [facility] [address] [place], [place], [address] Phone: [phone]   Fax:  [phone]  Name: [name] [name] MRN: [id number] Date of Birth: [birth year]

## 2017-05-05 DIAGNOSIS — M25561 Pain in right knee: Secondary | ICD-10-CM | POA: Diagnosis not present

## 2017-05-08 ENCOUNTER — Ambulatory Visit (HOSPITAL_COMMUNITY): Payer: 59 | Admitting: Physical Therapy

## 2017-05-08 ENCOUNTER — Telehealth (HOSPITAL_COMMUNITY): Payer: Self-pay | Admitting: Internal Medicine

## 2017-05-08 ENCOUNTER — Encounter: Payer: 59 | Admitting: Family Medicine

## 2017-05-08 NOTE — Telephone Encounter (Signed)
05/08/17  pt cx because she said her husband wasn't feeling well this morning

## 2017-05-11 ENCOUNTER — Ambulatory Visit (HOSPITAL_COMMUNITY): Payer: 59 | Admitting: Physical Therapy

## 2017-05-11 ENCOUNTER — Telehealth (HOSPITAL_COMMUNITY): Payer: Self-pay | Admitting: Internal Medicine

## 2017-05-11 NOTE — Telephone Encounter (Signed)
05/11/17  pt called and said she was doing better and isn't coming today but does want to keep the other 2 that are scheduled for next week

## 2017-05-15 ENCOUNTER — Ambulatory Visit (HOSPITAL_COMMUNITY): Payer: 59 | Admitting: Physical Therapy

## 2017-05-15 DIAGNOSIS — M25561 Pain in right knee: Secondary | ICD-10-CM | POA: Diagnosis not present

## 2017-05-15 DIAGNOSIS — M6281 Muscle weakness (generalized): Secondary | ICD-10-CM

## 2017-05-15 DIAGNOSIS — R262 Difficulty in walking, not elsewhere classified: Secondary | ICD-10-CM

## 2017-05-15 NOTE — Therapy (Signed)
[facility] [address] [place], [place], [address] Phone: [phone]   Fax:  [phone]  Physical Therapy Treatment  Patient Details  Name: [name] [name] MRN: [id number] Date of Birth: [birth year] Referring Provider: Daisy LazarJosh Chadwlll   Encounter Date: 05/15/2017  PT End of Session - 05/15/17 0853    Visit Number  3    Number of Visits  12    Date for PT Re-Evaluation  05/25/17    Authorization Type  [employer] employee    Authorization - Visit Number  3    Authorization - Number of Visits  12    PT Start Time  0825    PT Stop Time  0857    PT Time Calculation (min)  32 min    Activity Tolerance  Patient tolerated treatment well    Behavior During Therapy  [facility] for tasks assessed/performed       Past Medical History:  Diagnosis Date  . Asthma   . Hypertension     Past Surgical History:  Procedure Laterality Date  . COLONOSCOPY N/A 12/26/2016   Procedure: COLONOSCOPY;  Surgeon: West BaliFields, Sandi L, MD;  [facility];  Service: Endoscopy;  Laterality: N/A;  830   . NO PAST SURGERIES      There were no vitals filed for this visit.  Subjective Assessment - 05/15/17 0834    Subjective  Pt states she has had issues with getting here but reports compliance with HEP.   Currently with pain at 3/10, mostly at Rt medial knee. States the ortho MD gave her a knee brace to wear and she wears it approx every other day.  Returns to MD 1/28.    Currently in Pain?  Yes    Pain Score  3     Pain Location  Knee    Pain Orientation  Right;Medial    Pain Descriptors / Indicators  Aching                      [facility] Adult PT Treatment/Exercise - 05/15/17 0001      Knee/Hip Exercises: Stretches   Active Hamstring Stretch  3 reps;30 seconds;Limitations    Active Hamstring Stretch Limitations  on 12" step    Gastroc Stretch  Both;3 reps;30 seconds;Limitations    Gastroc Stretch Limitations  slant board      Knee/Hip  Exercises: Standing   Heel Raises  Both;15 reps    Knee Flexion  Strengthening;Both;15 reps    Forward Lunges  Both;10 reps;Limitations    Forward Lunges Limitations  onto 4" box no HHA    Lateral Step Up  Both;10 reps;Hand Hold: 1;Step Height: 4"    Forward Step Up  Both;10 reps;Hand Hold: 1;Step Height: 4"    Step Down  Both;10 reps;Hand Hold: 1;Step Height: 4"    SLS  30" each no HHA    Other Standing Knee Exercises  side stepping with BTB around knees 2RT      Knee/Hip Exercises: Seated   Sit to Sand  10 reps               PT Short Term Goals - 05/03/17 0906      PT SHORT TERM GOAL #1   Title  Pt to be walking in her home without an assistive device.     Time  3    Period  Weeks    Status  On-going      PT SHORT  TERM GOAL #2   Title  Pt  LE strength to be increased 1/2 grade to allow pt to be able to ascend and descend 8 steps with a hand rail     Time  3    Period  Weeks    Status  On-going      PT SHORT TERM GOAL #3   Title  Pt single leg stance on both legs to increase to 30 seconds for improved balance on uneven terrain.     Time  3    Period  Weeks    Status  On-going      PT SHORT TERM GOAL #4   Title  Pain level in pt Rt knee to be no greater than a 2/10 so that pt is only waking one time throughout a whole week.     Status  On-going        PT Long Term Goals - 05/03/17 0906      PT LONG TERM GOAL #1   Title  Pt to be walking without an assistive device outside     Time  6    Period  Weeks    Status  On-going      PT LONG TERM GOAL #2   Title  PT LE strength to be increased one grade to allow her to push herself up from a squatted postiion for work duties     Time  6    Period  Weeks    Status  On-going      PT LONG TERM GOAL #3   Title  PT to be able to walk at least 450 feet without an assistive device in a three minute period of time for more functional walking.     Time  6    Period  Weeks    Status  On-going      PT LONG TERM GOAL  #4   Title  Pt right knee pain to be no greater than a 1/10 to allow pt to sleep the whole night without difficulty.     Time  6    Period  Weeks    Status  On-going            Plan - 05/15/17 0857    Clinical Impression Statement  Pt was 10 minutes late for appt this session.  Reports complaince with HEP since has been absent from therapy.  Continued with established therex with addition of forward step downs, lunges and sidestepping with BTB.  Pt able to complete all exercises today without c/o pain or issues.  Cues required for form and posture.  Also with tendency to not keep correct count of therex, completing too many.     Rehab Potential  Good    PT Frequency  2x / week    PT Duration  6 weeks    PT Treatment/Interventions  Therapeutic activities;Therapeutic exercise;Gait training;Stair training;Functional mobility training;Balance training;Manual techniques    PT Next Visit Plan  Continue gait without assistive device.  Progress reps and difficulty of therex.    PT Home Exercise Plan  LAQ, sidelying abduction, prone knee flexion, prone hip extension.      Consulted and Agree with Plan of Care  Patient       Patient will benefit from skilled therapeutic intervention in order to improve the following deficits and impairments:  Abnormal gait, Decreased activity tolerance, Decreased balance, Decreased strength, Difficulty walking, Pain  Visit Diagnosis: Acute pain of right knee  Muscle  weakness (generalized)  Difficulty in walking, not elsewhere classified     Problem List Patient Active Problem List   Diagnosis Date Noted  . Acute bronchitis with asthma 10/31/2016  . Perennial allergic rhinitis 07/05/2016  . Chronic urticaria 07/05/2016  . Mild intermittent asthma, uncomplicated 07/05/2016  . Intrinsic asthma 03/06/2016  . Special screening for malignant neoplasms, colon 05/30/2015  . Postmenopausal atrophic vaginitis 10/23/2013  . Prediabetes 11/22/2011  .  Hyperpigmentation of skin 09/28/2011  . Seasonal allergies 03/15/2011  . Essential hypertension 12/01/2009  . Overweight 09/16/2008  . CONTACT DERMATITIS&OTHER ECZEMA DUE TO PLANTS 09/16/2008   [name] [name], PTA/CLT [phone]  [name] [name] 05/15/2017, 9:00 AM  [facility] [address] [place], [place], [address] Phone: [phone]   Fax:  [phone]  Name: [name] [name] MRN: [id number] Date of Birth: [birth year]

## 2017-05-18 ENCOUNTER — Telehealth (HOSPITAL_COMMUNITY): Payer: Self-pay | Admitting: Internal Medicine

## 2017-05-18 ENCOUNTER — Ambulatory Visit (HOSPITAL_COMMUNITY): Payer: 59 | Admitting: Physical Therapy

## 2017-05-18 NOTE — Telephone Encounter (Signed)
05/18/17  pt left a message to cx said she wouldn't be here today

## 2017-05-19 ENCOUNTER — Telehealth (HOSPITAL_COMMUNITY): Payer: Self-pay | Admitting: Physical Therapy

## 2017-05-19 DIAGNOSIS — M25561 Pain in right knee: Secondary | ICD-10-CM | POA: Diagnosis not present

## 2017-05-19 NOTE — Telephone Encounter (Signed)
Charlotte from Dr. [place] Nickelhadwill's office called requested last progress note be fax to their office due to seeing pt today. Fax [phone]

## 2017-05-29 ENCOUNTER — Telehealth (HOSPITAL_COMMUNITY): Payer: Self-pay | Admitting: Internal Medicine

## 2017-05-29 NOTE — Telephone Encounter (Signed)
05/29/17  I left a message asking if patient wanted to schedule more appts or be discharged.  She hasn't returned for therapy since 12/27 when she cancelled that last appt and had no others scheduled.  She has had 3 out of 12 so far.

## 2017-06-02 DIAGNOSIS — M25561 Pain in right knee: Secondary | ICD-10-CM | POA: Diagnosis not present

## 2017-07-13 DIAGNOSIS — Z299 Encounter for prophylactic measures, unspecified: Secondary | ICD-10-CM | POA: Diagnosis not present

## 2017-07-13 DIAGNOSIS — Z789 Other specified health status: Secondary | ICD-10-CM | POA: Diagnosis not present

## 2017-07-13 DIAGNOSIS — B373 Candidiasis of vulva and vagina: Secondary | ICD-10-CM | POA: Diagnosis not present

## 2017-07-13 DIAGNOSIS — I10 Essential (primary) hypertension: Secondary | ICD-10-CM | POA: Diagnosis not present

## 2017-07-13 DIAGNOSIS — Z6828 Body mass index (BMI) 28.0-28.9, adult: Secondary | ICD-10-CM | POA: Diagnosis not present

## 2017-07-21 DIAGNOSIS — M25561 Pain in right knee: Secondary | ICD-10-CM | POA: Diagnosis not present

## 2017-08-21 DIAGNOSIS — M47816 Spondylosis without myelopathy or radiculopathy, lumbar region: Secondary | ICD-10-CM | POA: Diagnosis not present

## 2017-08-21 DIAGNOSIS — Z6829 Body mass index (BMI) 29.0-29.9, adult: Secondary | ICD-10-CM | POA: Diagnosis not present

## 2017-08-21 DIAGNOSIS — Z299 Encounter for prophylactic measures, unspecified: Secondary | ICD-10-CM | POA: Diagnosis not present

## 2017-08-21 DIAGNOSIS — Z713 Dietary counseling and surveillance: Secondary | ICD-10-CM | POA: Diagnosis not present

## 2017-08-21 DIAGNOSIS — M545 Low back pain: Secondary | ICD-10-CM | POA: Diagnosis not present

## 2017-11-19 DIAGNOSIS — I10 Essential (primary) hypertension: Secondary | ICD-10-CM | POA: Diagnosis not present

## 2017-11-19 DIAGNOSIS — L239 Allergic contact dermatitis, unspecified cause: Secondary | ICD-10-CM | POA: Diagnosis not present

## 2017-11-21 DIAGNOSIS — I10 Essential (primary) hypertension: Secondary | ICD-10-CM | POA: Diagnosis not present

## 2017-11-21 DIAGNOSIS — Z1211 Encounter for screening for malignant neoplasm of colon: Secondary | ICD-10-CM | POA: Diagnosis not present

## 2017-11-21 DIAGNOSIS — R21 Rash and other nonspecific skin eruption: Secondary | ICD-10-CM | POA: Diagnosis not present

## 2017-11-21 DIAGNOSIS — Z1331 Encounter for screening for depression: Secondary | ICD-10-CM | POA: Diagnosis not present

## 2017-11-21 DIAGNOSIS — Z6828 Body mass index (BMI) 28.0-28.9, adult: Secondary | ICD-10-CM | POA: Diagnosis not present

## 2017-11-21 DIAGNOSIS — R5383 Other fatigue: Secondary | ICD-10-CM | POA: Diagnosis not present

## 2017-11-21 DIAGNOSIS — Z0000 Encounter for general adult medical examination without abnormal findings: Secondary | ICD-10-CM | POA: Diagnosis not present

## 2017-11-21 DIAGNOSIS — Z299 Encounter for prophylactic measures, unspecified: Secondary | ICD-10-CM | POA: Diagnosis not present

## 2017-11-21 DIAGNOSIS — Z79899 Other long term (current) drug therapy: Secondary | ICD-10-CM | POA: Diagnosis not present

## 2017-11-25 DIAGNOSIS — B373 Candidiasis of vulva and vagina: Secondary | ICD-10-CM | POA: Diagnosis not present

## 2017-11-26 DIAGNOSIS — B373 Candidiasis of vulva and vagina: Secondary | ICD-10-CM | POA: Diagnosis not present

## 2017-11-26 DIAGNOSIS — B3789 Other sites of candidiasis: Secondary | ICD-10-CM | POA: Diagnosis not present

## 2017-11-26 DIAGNOSIS — I10 Essential (primary) hypertension: Secondary | ICD-10-CM | POA: Diagnosis not present

## 2017-11-26 DIAGNOSIS — Z79899 Other long term (current) drug therapy: Secondary | ICD-10-CM | POA: Diagnosis not present

## 2017-11-28 DIAGNOSIS — Z299 Encounter for prophylactic measures, unspecified: Secondary | ICD-10-CM | POA: Diagnosis not present

## 2017-11-28 DIAGNOSIS — Z6828 Body mass index (BMI) 28.0-28.9, adult: Secondary | ICD-10-CM | POA: Diagnosis not present

## 2017-11-28 DIAGNOSIS — L309 Dermatitis, unspecified: Secondary | ICD-10-CM | POA: Diagnosis not present

## 2017-11-28 DIAGNOSIS — Z713 Dietary counseling and surveillance: Secondary | ICD-10-CM | POA: Diagnosis not present

## 2017-11-28 DIAGNOSIS — I10 Essential (primary) hypertension: Secondary | ICD-10-CM | POA: Diagnosis not present

## 2018-02-01 ENCOUNTER — Encounter (HOSPITAL_COMMUNITY): Payer: Self-pay | Admitting: Physical Therapy

## 2018-02-01 NOTE — Therapy (Signed)
[facility] [address] [place], [place], [address] Phone: [phone]   Fax:  [phone]  Patient Details  Name: [name] [name] MRN: [id number] Date of Birth: July 22, 1955 Referring Provider:   Baldo Ash  Encounter Date: 02/01/2018   PHYSICAL THERAPY DISCHARGE SUMMARY  Visits from Start of Care: 3  Current functional level related to goals / functional outcomes: Not met   Remaining deficits: Knee pain    Education / Equipment: HEP Plan: Patient agrees to discharge.  Patient goals were not met. Patient is being discharged due to not returning since the last visit.  ?????   Rayetta Humphrey, PT CLT [phone] 02/01/2018, 9:04 AM  [facility] [place], [place], [address] Phone: [phone]   Fax:  [phone]

## 2018-02-05 ENCOUNTER — Other Ambulatory Visit (HOSPITAL_COMMUNITY): Payer: Self-pay | Admitting: Nurse Practitioner

## 2018-02-05 DIAGNOSIS — Z1231 Encounter for screening mammogram for malignant neoplasm of breast: Secondary | ICD-10-CM
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
## 2018-02-07 ENCOUNTER — Ambulatory Visit (HOSPITAL_COMMUNITY)
Admission: RE | Admit: 2018-02-07 | Discharge: 2018-02-07 | Disposition: A | Discharge status: Home / Self Care | Payer: 59 | Source: Ambulatory Visit | Attending: Nurse Practitioner | Admitting: Nurse Practitioner

## 2018-02-07 DIAGNOSIS — Z1231 Encounter for screening mammogram for malignant neoplasm of breast: Secondary | ICD-10-CM | POA: Insufficient documentation

## 2018-02-26 DIAGNOSIS — I10 Essential (primary) hypertension: Secondary | ICD-10-CM | POA: Diagnosis not present

## 2018-02-26 DIAGNOSIS — Z713 Dietary counseling and surveillance: Secondary | ICD-10-CM | POA: Diagnosis not present

## 2018-02-26 DIAGNOSIS — M25561 Pain in right knee: Secondary | ICD-10-CM | POA: Diagnosis not present

## 2018-02-26 DIAGNOSIS — Z299 Encounter for prophylactic measures, unspecified: Secondary | ICD-10-CM | POA: Diagnosis not present

## 2018-02-26 DIAGNOSIS — M25562 Pain in left knee: Secondary | ICD-10-CM | POA: Diagnosis not present

## 2018-05-07 DIAGNOSIS — H35033 Hypertensive retinopathy, bilateral: Secondary | ICD-10-CM | POA: Diagnosis not present

## 2018-05-07 DIAGNOSIS — H524 Presbyopia: Secondary | ICD-10-CM | POA: Diagnosis not present

## 2018-06-04 DIAGNOSIS — Z6826 Body mass index (BMI) 26.0-26.9, adult: Secondary | ICD-10-CM | POA: Diagnosis not present

## 2018-06-04 DIAGNOSIS — Z789 Other specified health status: Secondary | ICD-10-CM | POA: Diagnosis not present

## 2018-06-04 DIAGNOSIS — I10 Essential (primary) hypertension: Secondary | ICD-10-CM | POA: Diagnosis not present

## 2018-06-04 DIAGNOSIS — Z299 Encounter for prophylactic measures, unspecified: Secondary | ICD-10-CM | POA: Diagnosis not present

## 2018-07-12 DIAGNOSIS — Z6826 Body mass index (BMI) 26.0-26.9, adult: Secondary | ICD-10-CM | POA: Diagnosis not present

## 2018-07-12 DIAGNOSIS — L853 Xerosis cutis: Secondary | ICD-10-CM | POA: Diagnosis not present

## 2018-07-12 DIAGNOSIS — Z299 Encounter for prophylactic measures, unspecified: Secondary | ICD-10-CM | POA: Diagnosis not present

## 2018-07-12 DIAGNOSIS — Z713 Dietary counseling and surveillance: Secondary | ICD-10-CM | POA: Diagnosis not present

## 2018-11-15 DIAGNOSIS — R778 Other specified abnormalities of plasma proteins: Secondary | ICD-10-CM | POA: Diagnosis not present

## 2018-11-15 DIAGNOSIS — I10 Essential (primary) hypertension: Secondary | ICD-10-CM | POA: Diagnosis not present

## 2018-11-15 DIAGNOSIS — Z1331 Encounter for screening for depression: Secondary | ICD-10-CM | POA: Diagnosis not present

## 2018-11-15 DIAGNOSIS — Z79899 Other long term (current) drug therapy: Secondary | ICD-10-CM | POA: Diagnosis not present

## 2018-11-15 DIAGNOSIS — Z1211 Encounter for screening for malignant neoplasm of colon: Secondary | ICD-10-CM | POA: Diagnosis not present

## 2018-11-15 DIAGNOSIS — Z299 Encounter for prophylactic measures, unspecified: Secondary | ICD-10-CM | POA: Diagnosis not present

## 2018-11-15 DIAGNOSIS — N390 Urinary tract infection, site not specified: Secondary | ICD-10-CM | POA: Diagnosis not present

## 2018-11-15 DIAGNOSIS — Z0000 Encounter for general adult medical examination without abnormal findings: Secondary | ICD-10-CM | POA: Diagnosis not present

## 2018-11-15 DIAGNOSIS — Z6826 Body mass index (BMI) 26.0-26.9, adult: Secondary | ICD-10-CM | POA: Diagnosis not present

## 2018-11-15 DIAGNOSIS — E039 Hypothyroidism, unspecified: Secondary | ICD-10-CM | POA: Diagnosis not present

## 2019-02-12 DIAGNOSIS — R21 Rash and other nonspecific skin eruption: Secondary | ICD-10-CM | POA: Diagnosis not present

## 2019-02-12 DIAGNOSIS — E039 Hypothyroidism, unspecified: Secondary | ICD-10-CM | POA: Diagnosis not present

## 2019-02-12 DIAGNOSIS — Z299 Encounter for prophylactic measures, unspecified: Secondary | ICD-10-CM | POA: Diagnosis not present

## 2019-02-12 DIAGNOSIS — I10 Essential (primary) hypertension: Secondary | ICD-10-CM | POA: Diagnosis not present

## 2019-02-12 DIAGNOSIS — Z6827 Body mass index (BMI) 27.0-27.9, adult: Secondary | ICD-10-CM | POA: Diagnosis not present

## 2019-04-15 ENCOUNTER — Other Ambulatory Visit (HOSPITAL_COMMUNITY): Payer: Self-pay | Admitting: Nurse Practitioner

## 2019-04-15 DIAGNOSIS — Z1231 Encounter for screening mammogram for malignant neoplasm of breast: Secondary | ICD-10-CM

## 2019-05-20 ENCOUNTER — Other Ambulatory Visit: Payer: Self-pay

## 2019-05-20 ENCOUNTER — Ambulatory Visit (HOSPITAL_COMMUNITY)
Admission: RE | Admit: 2019-05-20 | Discharge: 2019-05-20 | Disposition: A | Discharge status: Home / Self Care | Payer: 59 | Source: Ambulatory Visit | Attending: Nurse Practitioner | Admitting: Nurse Practitioner

## 2019-05-20 DIAGNOSIS — Z299 Encounter for prophylactic measures, unspecified: Secondary | ICD-10-CM | POA: Diagnosis not present

## 2019-05-20 DIAGNOSIS — Z6827 Body mass index (BMI) 27.0-27.9, adult: Secondary | ICD-10-CM | POA: Diagnosis not present

## 2019-05-20 DIAGNOSIS — I10 Essential (primary) hypertension: Secondary | ICD-10-CM | POA: Diagnosis not present

## 2019-05-20 DIAGNOSIS — Z1231 Encounter for screening mammogram for malignant neoplasm of breast: Secondary | ICD-10-CM | POA: Insufficient documentation

## 2019-05-20 DIAGNOSIS — E039 Hypothyroidism, unspecified: Secondary | ICD-10-CM | POA: Diagnosis not present

## 2019-05-20 DIAGNOSIS — J302 Other seasonal allergic rhinitis: Secondary | ICD-10-CM | POA: Diagnosis not present

## 2019-05-20 DIAGNOSIS — G4700 Insomnia, unspecified: Secondary | ICD-10-CM | POA: Diagnosis not present

## 2019-06-06 DIAGNOSIS — M25561 Pain in right knee: Secondary | ICD-10-CM | POA: Diagnosis not present

## 2019-06-06 DIAGNOSIS — M545 Low back pain: Secondary | ICD-10-CM | POA: Diagnosis not present

## 2019-06-15 DIAGNOSIS — M545 Low back pain: Secondary | ICD-10-CM | POA: Diagnosis not present

## 2019-06-15 DIAGNOSIS — M25561 Pain in right knee: Secondary | ICD-10-CM | POA: Diagnosis not present

## 2019-06-20 DIAGNOSIS — M25561 Pain in right knee: Secondary | ICD-10-CM | POA: Diagnosis not present

## 2019-06-28 DIAGNOSIS — Z789 Other specified health status: Secondary | ICD-10-CM | POA: Diagnosis not present

## 2019-06-28 DIAGNOSIS — J302 Other seasonal allergic rhinitis: Secondary | ICD-10-CM | POA: Diagnosis not present

## 2019-06-28 DIAGNOSIS — E039 Hypothyroidism, unspecified: Secondary | ICD-10-CM | POA: Diagnosis not present

## 2019-06-28 DIAGNOSIS — Z6827 Body mass index (BMI) 27.0-27.9, adult: Secondary | ICD-10-CM | POA: Diagnosis not present

## 2019-06-28 DIAGNOSIS — Z299 Encounter for prophylactic measures, unspecified: Secondary | ICD-10-CM | POA: Diagnosis not present

## 2019-06-28 DIAGNOSIS — I10 Essential (primary) hypertension: Secondary | ICD-10-CM | POA: Diagnosis not present

## 2019-09-04 DIAGNOSIS — E039 Hypothyroidism, unspecified: Secondary | ICD-10-CM | POA: Diagnosis not present

## 2019-09-04 DIAGNOSIS — Z79899 Other long term (current) drug therapy: Secondary | ICD-10-CM | POA: Diagnosis not present

## 2019-09-04 DIAGNOSIS — R5383 Other fatigue: Secondary | ICD-10-CM | POA: Diagnosis not present

## 2019-09-04 DIAGNOSIS — I10 Essential (primary) hypertension: Secondary | ICD-10-CM | POA: Diagnosis not present

## 2019-09-04 DIAGNOSIS — Z299 Encounter for prophylactic measures, unspecified: Secondary | ICD-10-CM | POA: Diagnosis not present

## 2019-09-05 DIAGNOSIS — R5383 Other fatigue: Secondary | ICD-10-CM | POA: Diagnosis not present

## 2019-09-05 DIAGNOSIS — Z79899 Other long term (current) drug therapy: Secondary | ICD-10-CM | POA: Diagnosis not present

## 2019-09-05 DIAGNOSIS — E039 Hypothyroidism, unspecified: Secondary | ICD-10-CM | POA: Diagnosis not present

## 2019-09-05 DIAGNOSIS — I10 Essential (primary) hypertension: Secondary | ICD-10-CM | POA: Diagnosis not present

## 2019-09-09 DIAGNOSIS — H2513 Age-related nuclear cataract, bilateral: Secondary | ICD-10-CM | POA: Diagnosis not present

## 2019-09-09 DIAGNOSIS — H40013 Open angle with borderline findings, low risk, bilateral: Secondary | ICD-10-CM | POA: Diagnosis not present

## 2019-09-12 DIAGNOSIS — Z23 Encounter for immunization: Secondary | ICD-10-CM | POA: Diagnosis not present

## 2019-09-20 DIAGNOSIS — Z299 Encounter for prophylactic measures, unspecified: Secondary | ICD-10-CM | POA: Diagnosis not present

## 2019-09-20 DIAGNOSIS — I10 Essential (primary) hypertension: Secondary | ICD-10-CM | POA: Diagnosis not present

## 2019-09-20 DIAGNOSIS — J309 Allergic rhinitis, unspecified: Secondary | ICD-10-CM | POA: Diagnosis not present

## 2019-10-11 DIAGNOSIS — Z23 Encounter for immunization: Secondary | ICD-10-CM | POA: Diagnosis not present

## 2020-01-31 DIAGNOSIS — H5203 Hypermetropia, bilateral: Secondary | ICD-10-CM | POA: Diagnosis not present

## 2021-01-07 ENCOUNTER — Other Ambulatory Visit (HOSPITAL_COMMUNITY): Payer: Self-pay | Admitting: Family Medicine

## 2021-01-07 ENCOUNTER — Other Ambulatory Visit: Payer: Self-pay

## 2021-01-07 ENCOUNTER — Ambulatory Visit (HOSPITAL_COMMUNITY)
Admission: RE | Admit: 2021-01-07 | Discharge: 2021-01-07 | Disposition: A | Discharge status: Home / Self Care | Payer: 59 | Source: Ambulatory Visit | Attending: Family Medicine | Admitting: Family Medicine

## 2021-01-07 DIAGNOSIS — Z1231 Encounter for screening mammogram for malignant neoplasm of breast: Secondary | ICD-10-CM | POA: Insufficient documentation

## 2021-01-13 ENCOUNTER — Inpatient Hospital Stay (HOSPITAL_COMMUNITY): Admission: RE | Admit: 2021-01-13 | Payer: 59 | Source: Ambulatory Visit

## 2021-03-28 ENCOUNTER — Other Ambulatory Visit: Payer: Self-pay

## 2021-03-28 ENCOUNTER — Encounter: Payer: Self-pay | Admitting: *Deleted

## 2021-03-28 ENCOUNTER — Ambulatory Visit
Admission: EM | Admit: 2021-03-28 | Discharge: 2021-03-28 | Disposition: A | Discharge status: Home / Self Care | Payer: 59 | Attending: Family Medicine | Admitting: Family Medicine

## 2021-03-28 DIAGNOSIS — T7840XA Allergy, unspecified, initial encounter: Secondary | ICD-10-CM | POA: Diagnosis not present

## 2021-03-28 DIAGNOSIS — W57XXXA Bitten or stung by nonvenomous insect and other nonvenomous arthropods, initial encounter: Secondary | ICD-10-CM | POA: Diagnosis not present

## 2021-03-28 MED ORDER — TRIAMCINOLONE ACETONIDE 0.1 % EX CREA: 1.0000 "application " | TOPICAL_CREAM | Freq: Two times a day (BID) | CUTANEOUS | 0 refills | Status: AC

## 2021-03-28 MED ORDER — PREDNISONE 20 MG PO TABS: 40.0000 mg | ORAL_TABLET | Freq: Every day | ORAL | 0 refills | Status: AC

## 2021-03-28 MED ORDER — CETIRIZINE HCL 10 MG PO TABS: 10.0000 mg | ORAL_TABLET | Freq: Two times a day (BID) | ORAL | 0 refills | Status: AC

## 2021-03-28 NOTE — ED Triage Notes (Signed)
Pt states noticing prurutic red lesion to left lower leg 4 days ago.  This AM woke with similar lesion and pruritis to right jaw area.  States had some "insect bite" like lesions on LUE last wk which resolved.  Left lower leg swollen.

## 2021-03-28 NOTE — ED Provider Notes (Signed)
[facility]    CSN: [id number] Arrival date & time: 03/28/21  0813      History   Chief Complaint No chief complaint on file.   HPI [name] [name] is a 65 y.o. female.   Patient presenting today with redness, swelling, itching to the left lateral lower leg from an insect bite that she got in this area.  She is also now got a bite on the right side of her face that has a similar area of redness swelling and itching.  She is unsure of what might be biting her but has been treating her bed in her home for insects.  She denies fever, chills, chest tightness, throat swelling or itching, nausea, vomiting, difficulty breathing.  Has not been trying anything over-the-counter other than ice with minimal relief.   Past Medical History:  Diagnosis Date   Asthma    Hypertension     Patient Active Problem List   Diagnosis Date Noted   Acute bronchitis with asthma 10/31/2016   Perennial allergic rhinitis 07/05/2016   Chronic urticaria 07/05/2016   Mild intermittent asthma, uncomplicated 07/05/2016   Intrinsic asthma 03/06/2016   Special screening for malignant neoplasms, colon 05/30/2015   Postmenopausal atrophic vaginitis 10/23/2013   Prediabetes 11/22/2011   Hyperpigmentation of skin 09/28/2011   Seasonal allergies 03/15/2011   Essential hypertension 12/01/2009   Overweight 09/16/2008   CONTACT DERMATITIS&OTHER ECZEMA DUE TO PLANTS 09/16/2008    Past Surgical History:  Procedure Laterality Date   COLONOSCOPY N/A 12/26/2016   Procedure: COLONOSCOPY;  Surgeon: West Bali, MD;  Location: [facility];  Service: Endoscopy;  Laterality: N/A;  830    NO PAST SURGERIES      OB History   No obstetric history on file.      Home Medications    Prior to Admission medications   Medication Sig Start Date End Date Taking? Authorizing Provider  cetirizine (ZYRTEC ALLERGY) 10 MG tablet Take 1 tablet (10 mg total) by mouth 2 (two) times daily. 03/28/21  Yes Particia Nearing, PA-C  hydrochlorothiazide (HYDRODIURIL) 25 MG tablet Take 1 tablet (25 mg total) by mouth daily. Discontinued Benazepril/hCTZ effective 01/18/2017 01/18/17  Yes Kerri Perches, MD  potassium chloride SA (K-DUR,KLOR-CON) 20 MEQ tablet Take 1 tablet (20 mEq total) by mouth daily. 01/18/17  Yes Kerri Perches, MD  predniSONE (DELTASONE) 20 MG tablet Take 2 tablets (40 mg total) by mouth daily with breakfast. 03/28/21  Yes Particia Nearing, PA-C  triamcinolone cream (KENALOG) 0.1 % Apply 1 application topically 2 (two) times daily. 03/28/21  Yes Particia Nearing, PA-C  albuterol [facility]) [phone] Base) MCG/ACT inhaler Inhale 2 puffs into the lungs every 4 (four) hours as needed for wheezing or shortness of breath. 07/05/16   Alfonse Spruce, MD  ibuprofen (ADVIL,MOTRIN) 200 MG tablet Take 200 mg by mouth daily as needed for headache or moderate pain.    [provider]  oxyCODONE-acetaminophen (ROXICET) 5-325 MG tablet Take 1 tablet by mouth every 4 (four) hours as needed for severe pain. 03/02/17   Darci Current, MD  oxymetazoline (AFRIN) 0.05 % nasal spray Place 1 spray into both nostrils daily as needed for congestion.    [provider]    Family History Family History  Problem Relation Age of Onset   Arthritis Mother    Allergic rhinitis Neg Hx    Angioedema Neg Hx    Asthma Neg Hx  Eczema Neg Hx    Colon cancer Neg Hx    Colon polyps Neg Hx     Social History Social History   Tobacco Use   Smoking status: Never   Smokeless tobacco: Never  Vaping Use   Vaping Use: Never used  Substance Use Topics   Alcohol use: No   Drug use: No     Allergies   Patient has no known allergies.   Review of Systems Review of Systems Per HPI  Physical Exam Triage Vital Signs ED Triage Vitals  Enc Vitals Group     BP 03/28/21 0825 130/85     Pulse Rate 03/28/21 0825 77     Resp 03/28/21 0824 18     Temp 03/28/21 0824 97.9 F  (36.6 C)     Temp Source 03/28/21 0824 Temporal     SpO2 03/28/21 0825 98 %     Weight --      Height --      Head Circumference --      Peak Flow --      Pain Score 03/28/21 0826 0     Pain Loc --      Pain Edu? --      Excl. in GC? --    No data found.  Updated Vital Signs BP 130/85   Pulse 77   Temp 97.9 F (36.6 C) (Temporal)   Resp 18   SpO2 98%   Visual Acuity Right Eye Distance:   Left Eye Distance:   Bilateral Distance:    Right Eye Near:   Left Eye Near:    Bilateral Near:     Physical Exam Vitals and nursing note reviewed.  Constitutional:      General: She is not in acute distress.    Appearance: Normal appearance. She is not ill-appearing or diaphoretic.  HENT:     Head: Atraumatic.     Mouth/Throat:     Mouth: Mucous membranes are moist.     Pharynx: Oropharynx is clear.  Eyes:     Extraocular Movements: Extraocular movements intact.     Conjunctiva/sclera: Conjunctivae normal.  Cardiovascular:     Rate and Rhythm: Normal rate and regular rhythm.     Heart sounds: Normal heart sounds.  Pulmonary:     Effort: Pulmonary effort is normal.     Breath sounds: Normal breath sounds. No wheezing or rales.  Musculoskeletal:        General: Normal range of motion.     Cervical back: Normal range of motion and neck supple.  Skin:    General: Skin is warm.     Findings: Erythema present.     Comments: Erythematous insect bite noted to the left lateral lower leg with surrounding erythema, edema.  No fluctuance, induration, drainage to the area.  Also a small area similarly to the right cheek.  Neurological:     Mental Status: She is alert and oriented to person, place, and time.  Psychiatric:        Mood and Affect: Mood normal.        Thought Content: Thought content normal.        Judgment: Judgment normal.     UC Treatments / Results  Labs (all labs ordered are listed, but only abnormal results are displayed) Labs Reviewed - No data to  display  EKG   Radiology No results found.  Procedures Procedures (including critical care time)  Medications Ordered in UC Medications - No data to display  Initial Impression / Assessment and Plan / UC Course  I have reviewed the triage vital signs and the nursing notes.  Pertinent labs & imaging results that were available during my care of the patient were reviewed by me and considered in my medical decision making (see chart for details).     Suspect localized allergic reaction to insect bites.  We will treat with Zyrtec twice daily, triamcinolone cream, short prednisone burst.  Discussed ice off-and-on, elevation of the leg.    Final Clinical Impressions(s) / UC Diagnoses   Final diagnoses:  Allergic reaction, initial encounter  Insect bite, unspecified site, initial encounter   Discharge Instructions   None    ED Prescriptions     Medication Sig Dispense Auth. Provider   predniSONE (DELTASONE) 20 MG tablet Take 2 tablets (40 mg total) by mouth daily with breakfast. 10 tablet Particia Nearing, PA-C   triamcinolone cream (KENALOG) 0.1 % Apply 1 application topically 2 (two) times daily. 80 g Particia Nearing, [place]   cetirizine (ZYRTEC ALLERGY) 10 MG tablet Take 1 tablet (10 mg total) by mouth 2 (two) times daily. 30 tablet Particia Nearing, [place]      PDMP not reviewed this encounter.   Particia Nearing, [place] 03/28/21 [phone]

## 2021-12-29 ENCOUNTER — Other Ambulatory Visit: Payer: Self-pay | Admitting: Internal Medicine

## 2021-12-29 DIAGNOSIS — Z79899 Other long term (current) drug therapy: Secondary | ICD-10-CM | POA: Diagnosis not present

## 2021-12-29 DIAGNOSIS — Z1231 Encounter for screening mammogram for malignant neoplasm of breast: Secondary | ICD-10-CM

## 2021-12-29 DIAGNOSIS — Z01419 Encounter for gynecological examination (general) (routine) without abnormal findings: Secondary | ICD-10-CM | POA: Diagnosis not present

## 2021-12-29 DIAGNOSIS — Z0000 Encounter for general adult medical examination without abnormal findings: Secondary | ICD-10-CM | POA: Diagnosis not present

## 2021-12-29 DIAGNOSIS — E039 Hypothyroidism, unspecified: Secondary | ICD-10-CM | POA: Diagnosis not present

## 2022-02-02 ENCOUNTER — Ambulatory Visit
Admission: RE | Admit: 2022-02-02 | Discharge: 2022-02-02 | Disposition: A | Discharge status: Home / Self Care | Payer: 59 | Source: Ambulatory Visit | Attending: Internal Medicine | Admitting: Internal Medicine

## 2022-02-02 DIAGNOSIS — Z1231 Encounter for screening mammogram for malignant neoplasm of breast: Secondary | ICD-10-CM | POA: Diagnosis not present

## 2022-03-24 ENCOUNTER — Other Ambulatory Visit: Payer: Self-pay | Admitting: Internal Medicine

## 2022-03-24 DIAGNOSIS — Z1231 Encounter for screening mammogram for malignant neoplasm of breast: Secondary | ICD-10-CM

## 2022-05-11 DIAGNOSIS — Z20822 Contact with and (suspected) exposure to covid-19: Secondary | ICD-10-CM | POA: Diagnosis not present

## 2022-05-11 DIAGNOSIS — I10 Essential (primary) hypertension: Secondary | ICD-10-CM | POA: Diagnosis not present

## 2022-05-11 DIAGNOSIS — J1089 Influenza due to other identified influenza virus with other manifestations: Secondary | ICD-10-CM | POA: Diagnosis not present

## 2022-12-07 DIAGNOSIS — Z299 Encounter for prophylactic measures, unspecified: Secondary | ICD-10-CM | POA: Diagnosis not present

## 2022-12-07 DIAGNOSIS — M20002 Unspecified deformity of left finger(s): Secondary | ICD-10-CM | POA: Diagnosis not present

## 2022-12-07 DIAGNOSIS — I10 Essential (primary) hypertension: Secondary | ICD-10-CM | POA: Diagnosis not present

## 2022-12-20 DIAGNOSIS — M79644 Pain in right finger(s): Secondary | ICD-10-CM | POA: Diagnosis not present

## 2023-01-10 DIAGNOSIS — M1990 Unspecified osteoarthritis, unspecified site: Secondary | ICD-10-CM | POA: Diagnosis not present

## 2023-01-10 DIAGNOSIS — Z8249 Family history of ischemic heart disease and other diseases of the circulatory system: Secondary | ICD-10-CM | POA: Diagnosis not present

## 2023-01-10 DIAGNOSIS — E039 Hypothyroidism, unspecified: Secondary | ICD-10-CM | POA: Diagnosis not present

## 2023-01-10 DIAGNOSIS — I10 Essential (primary) hypertension: Secondary | ICD-10-CM | POA: Diagnosis not present

## 2023-01-10 DIAGNOSIS — M545 Low back pain, unspecified: Secondary | ICD-10-CM | POA: Diagnosis not present

## 2023-01-10 DIAGNOSIS — F419 Anxiety disorder, unspecified: Secondary | ICD-10-CM | POA: Diagnosis not present

## 2023-01-12 DIAGNOSIS — Z79899 Other long term (current) drug therapy: Secondary | ICD-10-CM | POA: Diagnosis not present

## 2023-01-12 DIAGNOSIS — I10 Essential (primary) hypertension: Secondary | ICD-10-CM | POA: Diagnosis not present

## 2023-01-12 DIAGNOSIS — Z0000 Encounter for general adult medical examination without abnormal findings: Secondary | ICD-10-CM | POA: Diagnosis not present

## 2023-01-12 DIAGNOSIS — E039 Hypothyroidism, unspecified: Secondary | ICD-10-CM | POA: Diagnosis not present

## 2023-01-12 DIAGNOSIS — Z1331 Encounter for screening for depression: Secondary | ICD-10-CM | POA: Diagnosis not present

## 2023-01-12 DIAGNOSIS — Z299 Encounter for prophylactic measures, unspecified: Secondary | ICD-10-CM | POA: Diagnosis not present

## 2023-02-17 ENCOUNTER — Ambulatory Visit
Admission: RE | Admit: 2023-02-17 | Discharge: 2023-02-17 | Disposition: A | Discharge status: Home / Self Care | Payer: 59 | Source: Ambulatory Visit | Attending: Internal Medicine | Admitting: Internal Medicine

## 2023-02-17 ENCOUNTER — Other Ambulatory Visit: Payer: Self-pay | Admitting: Internal Medicine

## 2023-02-17 DIAGNOSIS — Z1231 Encounter for screening mammogram for malignant neoplasm of breast: Secondary | ICD-10-CM

## 2023-02-17 DIAGNOSIS — Z23 Encounter for immunization: Secondary | ICD-10-CM | POA: Diagnosis not present

## 2023-09-12 DIAGNOSIS — M6530 Trigger finger, unspecified finger: Secondary | ICD-10-CM | POA: Diagnosis not present

## 2023-09-12 DIAGNOSIS — M79645 Pain in left finger(s): Secondary | ICD-10-CM | POA: Diagnosis not present

## 2023-09-12 DIAGNOSIS — Z299 Encounter for prophylactic measures, unspecified: Secondary | ICD-10-CM | POA: Diagnosis not present

## 2023-09-12 DIAGNOSIS — I10 Essential (primary) hypertension: Secondary | ICD-10-CM | POA: Diagnosis not present

## 2023-09-12 DIAGNOSIS — R52 Pain, unspecified: Secondary | ICD-10-CM | POA: Diagnosis not present

## 2023-12-29 NOTE — Progress Notes (Signed)
   12/29/2023  Patient ID: [name] [name], female   DOB: 1955-11-26, 68 y.o.   MRN: [id number]  Reviewed patient regarding medication adherence from a quality report for [facility].   Per [facility] and payer portal fill history:  1. Losartan-Hydrochlorothiazide  100-12.5 mg - last filled 12/03/23 for a 90-day supply.   I will follow up for adherence monitoring.  Thank you for allowing pharmacy to be a part of this patient's care.    Heather Factor, PharmD Clinical Pharmacist  [phone]

## 2024-01-17 DIAGNOSIS — Z1389 Encounter for screening for other disorder: Secondary | ICD-10-CM | POA: Diagnosis not present

## 2024-01-17 DIAGNOSIS — R350 Frequency of micturition: Secondary | ICD-10-CM | POA: Diagnosis not present

## 2024-01-17 DIAGNOSIS — Z299 Encounter for prophylactic measures, unspecified: Secondary | ICD-10-CM | POA: Diagnosis not present

## 2024-01-17 DIAGNOSIS — Z0000 Encounter for general adult medical examination without abnormal findings: Secondary | ICD-10-CM | POA: Diagnosis not present

## 2024-01-17 DIAGNOSIS — E039 Hypothyroidism, unspecified: Secondary | ICD-10-CM | POA: Diagnosis not present

## 2024-01-17 DIAGNOSIS — Z79899 Other long term (current) drug therapy: Secondary | ICD-10-CM | POA: Diagnosis not present

## 2024-01-17 DIAGNOSIS — Z7189 Other specified counseling: Secondary | ICD-10-CM | POA: Diagnosis not present

## 2024-01-31 DIAGNOSIS — Z299 Encounter for prophylactic measures, unspecified: Secondary | ICD-10-CM | POA: Diagnosis not present

## 2024-01-31 DIAGNOSIS — I10 Essential (primary) hypertension: Secondary | ICD-10-CM | POA: Diagnosis not present

## 2024-01-31 DIAGNOSIS — M8580 Other specified disorders of bone density and structure, unspecified site: Secondary | ICD-10-CM | POA: Diagnosis not present

## 2024-02-02 ENCOUNTER — Other Ambulatory Visit: Payer: Self-pay | Admitting: Internal Medicine

## 2024-02-02 DIAGNOSIS — Z1231 Encounter for screening mammogram for malignant neoplasm of breast: Secondary | ICD-10-CM

## 2024-02-20 ENCOUNTER — Other Ambulatory Visit: Payer: Self-pay | Admitting: Internal Medicine

## 2024-02-20 DIAGNOSIS — Z1231 Encounter for screening mammogram for malignant neoplasm of breast: Secondary | ICD-10-CM

## 2024-02-27 ENCOUNTER — Inpatient Hospital Stay: Admission: RE | Admit: 2024-02-27 | Source: Ambulatory Visit

## 2024-02-27 ENCOUNTER — Encounter

## 2024-04-10 ENCOUNTER — Ambulatory Visit
Admission: RE | Admit: 2024-04-10 | Discharge: 2024-04-10 | Disposition: A | Discharge status: Home / Self Care | Source: Ambulatory Visit | Attending: Internal Medicine | Admitting: Internal Medicine

## 2024-04-10 DIAGNOSIS — Z1231 Encounter for screening mammogram for malignant neoplasm of breast: Secondary | ICD-10-CM
